# Patient Record
Sex: Female | Born: 1990 | Race: Black or African American | Hispanic: No | Marital: Single | Smoking: Never smoker
Health system: Southern US, Community
[De-identification: ages and names within clinical notes are randomized; demographics above are authoritative.]

## PROBLEM LIST (undated history)

## (undated) DIAGNOSIS — J45909 Unspecified asthma, uncomplicated: Secondary | ICD-10-CM

## (undated) DIAGNOSIS — Z3490 Encounter for supervision of normal pregnancy, unspecified, unspecified trimester: Secondary | ICD-10-CM

## (undated) DIAGNOSIS — R519 Headache, unspecified: Secondary | ICD-10-CM

## (undated) DIAGNOSIS — L509 Urticaria, unspecified: Secondary | ICD-10-CM

## (undated) DIAGNOSIS — R51 Headache: Secondary | ICD-10-CM

## (undated) DIAGNOSIS — J309 Allergic rhinitis, unspecified: Secondary | ICD-10-CM

## (undated) DIAGNOSIS — F419 Anxiety disorder, unspecified: Secondary | ICD-10-CM

## (undated) HISTORY — DX: Headache: R51

## (undated) HISTORY — DX: Urticaria, unspecified: L50.9

## (undated) HISTORY — DX: Unspecified asthma, uncomplicated: J45.909

## (undated) HISTORY — DX: Headache, unspecified: R51.9

## (undated) HISTORY — DX: Allergic rhinitis, unspecified: J30.9

---
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
## 2009-08-27 ENCOUNTER — Emergency Department (HOSPITAL_COMMUNITY): Admission: EM | Admit: 2009-08-27 | Discharge: 2009-08-27 | Payer: Self-pay | Admitting: Emergency Medicine

## 2009-08-31 ENCOUNTER — Emergency Department (HOSPITAL_COMMUNITY): Admission: EM | Admit: 2009-08-31 | Discharge: 2009-08-31 | Payer: Self-pay | Admitting: Emergency Medicine

## 2010-01-21 ENCOUNTER — Emergency Department (HOSPITAL_COMMUNITY)
Admission: EM | Admit: 2010-01-21 | Discharge: 2010-01-21 | Payer: Self-pay | Source: Home / Self Care | Admitting: Family Medicine

## 2010-03-13 ENCOUNTER — Inpatient Hospital Stay (INDEPENDENT_AMBULATORY_CARE_PROVIDER_SITE_OTHER)
Admission: RE | Admit: 2010-03-13 | Discharge: 2010-03-13 | Disposition: A | Discharge status: Home / Self Care | Payer: PRIVATE HEALTH INSURANCE | Source: Ambulatory Visit | Attending: Emergency Medicine | Admitting: Emergency Medicine

## 2010-03-13 DIAGNOSIS — J398 Other specified diseases of upper respiratory tract: Secondary | ICD-10-CM

## 2010-04-19 LAB — STREP A DNA PROBE

## 2010-04-19 LAB — URINALYSIS, ROUTINE W REFLEX MICROSCOPIC
Glucose, UA: NEGATIVE mg/dL
Ketones, ur: NEGATIVE mg/dL
Nitrite: NEGATIVE
Protein, ur: NEGATIVE mg/dL
Urobilinogen, UA: 0.2 mg/dL (ref 0.0–1.0)

## 2010-04-19 LAB — POCT PREGNANCY, URINE: Preg Test, Ur: NEGATIVE

## 2014-09-28 ENCOUNTER — Emergency Department (HOSPITAL_COMMUNITY)
Admission: EM | Admit: 2014-09-28 | Discharge: 2014-09-28 | Disposition: A | Discharge status: Home / Self Care | Payer: BLUE CROSS/BLUE SHIELD | Attending: Emergency Medicine | Admitting: Emergency Medicine

## 2014-09-28 ENCOUNTER — Encounter (HOSPITAL_COMMUNITY): Payer: Self-pay | Admitting: *Deleted

## 2014-09-28 DIAGNOSIS — K1379 Other lesions of oral mucosa: Secondary | ICD-10-CM | POA: Diagnosis not present

## 2014-09-28 DIAGNOSIS — Z880 Allergy status to penicillin: Secondary | ICD-10-CM | POA: Insufficient documentation

## 2014-09-28 DIAGNOSIS — K121 Other forms of stomatitis: Secondary | ICD-10-CM

## 2014-09-28 DIAGNOSIS — Z3202 Encounter for pregnancy test, result negative: Secondary | ICD-10-CM | POA: Diagnosis not present

## 2014-09-28 LAB — POC URINE PREG, ED: Preg Test, Ur: NEGATIVE

## 2014-09-28 MED ORDER — CLINDAMYCIN HCL 150 MG PO CAPS: 300.0000 mg | ORAL_CAPSULE | Freq: Three times a day (TID) | ORAL | Status: DC

## 2014-09-28 MED ORDER — MAGIC MOUTHWASH: 5.0000 mL | Freq: Four times a day (QID) | ORAL | Status: DC | PRN

## 2014-09-28 NOTE — ED Notes (Signed)
Pt states that she was recently on Flagyl. Pt began having N/V and not has sores to her bottom lip and inside of her mouth. Pt was sent here by her MD for r/o steven johnson syndrome. Pt reports difficulty swallowing

## 2014-09-28 NOTE — ED Provider Notes (Signed)
CSN: [id number]     Arrival date & time 09/28/14  1240 History   First MD Initiated Contact with Patient 09/28/14 1320     Chief Complaint  Patient presents with  . Allergic Reaction     (Consider location/radiation/quality/duration/timing/severity/associated sxs/prior Treatment) Patient is a 24 y.o. female presenting with mouth sores.  Mouth Lesions Location:  Lower lip and tongue Lower lip location:  L outer Quality:  Red and blistered (dry skin) Onset quality:  Gradual Severity:  Moderate Duration:  2 days Progression:  Improving Chronicity:  New Context: change in medications and possible infection   Relieved by:  Nothing Worsened by:  Nothing tried Ineffective treatments:  None tried Associated symptoms: no congestion, no fever (on sunday but now resolved), no neck pain, no rash, no rhinorrhea and no sore throat     History reviewed. No pertinent past medical history. History reviewed. No pertinent past surgical history. No family history on file. Social History  Substance Use Topics  . Smoking status: Never Smoker   . Smokeless tobacco: None  . Alcohol Use: None   OB History    No data available     Review of Systems  Constitutional: Negative for fever (on sunday but now resolved).  HENT: Positive for mouth sores. Negative for congestion, rhinorrhea and sore throat.   Eyes: Negative for visual disturbance.  Respiratory: Negative for cough and shortness of breath.   Cardiovascular: Negative for chest pain.  Gastrointestinal: Negative for nausea, vomiting (resolved (on sunday), abdominal pain and diarrhea (resolved (on sunday).  Genitourinary: Negative for difficulty urinating.  Musculoskeletal: Negative for back pain and neck pain.  Skin: Negative for rash.  Neurological: Negative for syncope and headaches.      Allergies  Flagyl and Penicillins  Home Medications   Prior to Admission medications   Medication Sig Start Date End Date Taking? Authorizing  Provider  clindamycin (CLEOCIN) 150 MG capsule Take 2 capsules (300 mg total) by mouth 3 (three) times daily. 09/28/14   Alvira Monday, MD  magic mouthwash SOLN Take 5 mLs by mouth 4 (four) times daily as needed for mouth pain. 09/28/14   Alvira Monday, MD   BP 118/75 mmHg  Pulse 77  Temp(Src) 98 F (36.7 C) (Oral)  Resp 15  Ht  (1.753 m)  Wt 144 lb (65.318 kg)  BMI 21.26 kg/m2  SpO2 100%  LMP 09/07/2014 Physical Exam  Constitutional: She is oriented to person, place, and time. She appears well-developed and well-nourished. No distress.  HENT:  Head: Normocephalic and atraumatic.  Mouth/Throat: No oropharyngeal exudate.  Yellow plaque on tongue and removed tiny vesicles and patient reports tenderness. Left lower lip has area 1 cm which is dry, crusted. No surrounding erythema  Eyes: Conjunctivae and EOM are normal.  Neck: Normal range of motion.  Cardiovascular: Normal rate, regular rhythm, normal heart sounds and intact distal pulses.  Exam reveals no gallop and no friction rub.   No murmur heard. Pulmonary/Chest: Effort normal and breath sounds normal. No respiratory distress. She has no wheezes. She has no rales.  Abdominal: Soft. She exhibits no distension. There is no tenderness. There is no guarding.  Musculoskeletal: She exhibits no edema or tenderness.  Neurological: She is alert and oriented to person, place, and time.  Skin: Skin is warm and dry. No rash noted. She is not diaphoretic. No erythema.  Nursing note and vitals reviewed.   ED Course  Procedures (including critical care time) Labs Review Labs Reviewed  POC  URINE PREG, ED    Imaging Review No results found. I have personally reviewed and evaluated these images and lab results as part of my medical decision-making.   EKG Interpretation None      MDM   Final diagnoses:  Mouth ulceration   24 year old female with history of bacterial vaginosis diagnosed last Sunday started on Flagyl presents  with concern of mouth sores and lips or from urgent care.  Patient reports she had testing and treatment for Bactrim vaginosis last Sunday (metronidazole however then developed nausea, vomiting, diarrhea and fevers on Sunday. Reports she began to feel better and on Tuesday night chiefly to [place] where she starts to notice sores on her tongue, and Wednesday developed a patch of sore dry skin on her lower lip.  Patient went to urgent care where she was evaluated and she was sent here for evaluation for Stevens-Johnson syndrome.  Patient is hemodynamically stable, well-appearing, without conjunctival erythema, without rash other than sores in the mouth.  Patient reports she was tested with HIV with last concerning exposure 4 weeks prior and this was negative. Patient with yellow plaque on tongue when scraped away her tiny vesicles which are sore per patient. She does not have any pharyngeal vesicles or erythema.  Possible viral cause including possible coxsackie (exp in setting of recent n/v/diarrhea/fever) or herpes, although lip lesion not classic.  Discussed the patient should stop Flagyl given it unclear if this contributed.  Prescribed clindamycin as replacement for patient's continuing symptomatic bacterial vaginosis. Prescribed Magic mouthwash for symptomatic relief.  Patient's recommended a follow-up with the primary care physician. She is discharged in stable condition with understanding of reasons to return.    Alvira Monday, MD 09/29/14 [phone]

## 2015-01-06 ENCOUNTER — Emergency Department (INDEPENDENT_AMBULATORY_CARE_PROVIDER_SITE_OTHER): Payer: BLUE CROSS/BLUE SHIELD

## 2015-01-06 ENCOUNTER — Encounter (HOSPITAL_COMMUNITY): Payer: Self-pay | Admitting: Emergency Medicine

## 2015-01-06 ENCOUNTER — Emergency Department (INDEPENDENT_AMBULATORY_CARE_PROVIDER_SITE_OTHER)
Admission: EM | Admit: 2015-01-06 | Discharge: 2015-01-06 | Disposition: A | Discharge status: Home / Self Care | Payer: BLUE CROSS/BLUE SHIELD | Source: Home / Self Care | Attending: Emergency Medicine | Admitting: Emergency Medicine

## 2015-01-06 DIAGNOSIS — M79641 Pain in right hand: Secondary | ICD-10-CM | POA: Diagnosis not present

## 2015-01-06 DIAGNOSIS — S6391XA Sprain of unspecified part of right wrist and hand, initial encounter: Secondary | ICD-10-CM

## 2015-01-06 NOTE — ED Notes (Signed)
Reports fall today, slipped, stumbled, landed on right hand.  Patient said she had keys in right hand and landed still gripping keys.  Fake nail on little finger was snapped, pulling away part of patient's natural nail.  Patient reports pain along lateral edge of hand.  Patient also has pain at base of thumb and runs along this edge of wrist and forearm.

## 2015-01-06 NOTE — Discharge Instructions (Signed)
Musculoskeletal Pain Musculoskeletal pain is muscle and boney aches and pains. These pains can occur in any part of the body. Your caregiver may treat you without knowing the cause of the pain. They may treat you if blood or urine tests, X-rays, and other tests were normal.  CAUSES There is often not a definite cause or reason for these pains. These pains may be caused by a type of germ (virus). The discomfort may also come from overuse. Overuse includes working out too hard when your body is not fit. Boney aches also come from weather changes. Bone is sensitive to atmospheric pressure changes. HOME CARE INSTRUCTIONS   Ask when your test results will be ready. Make sure you get your test results.  Only take over-the-counter or prescription medicines for pain, discomfort, or fever as directed by your caregiver. If you were given medications for your condition, do not drive, operate machinery or power tools, or sign legal documents for 24 hours. Do not drink alcohol. Do not take sleeping pills or other medications that may interfere with treatment.  Continue all activities unless the activities cause more pain. When the pain lessens, slowly resume normal activities. Gradually increase the intensity and duration of the activities or exercise.  During periods of severe pain, bed rest may be helpful. Lay or sit in any position that is comfortable.  Putting ice on the injured area.  Put ice in a bag.  Place a towel between your skin and the bag.  Leave the ice on for 15 to 20 minutes, 3 to 4 times a day.  Follow up with your caregiver for continued problems and no reason can be found for the pain. If the pain becomes worse or does not go away, it may be necessary to repeat tests or do additional testing. Your caregiver may need to look further for a possible cause. SEEK IMMEDIATE MEDICAL CARE IF:  You have pain that is getting worse and is not relieved by medications.  You develop chest pain  that is associated with shortness or breath, sweating, feeling sick to your stomach (nauseous), or throw up (vomit).  Your pain becomes localized to the abdomen.  You develop any new symptoms that seem different or that concern you. MAKE SURE YOU:   Understand these instructions.  Will watch your condition.  Will get help right away if you are not doing well or get worse.   This information is not intended to replace advice given to you by your health care provider. Make sure you discuss any questions you have with your health care provider.  You have a contusion or sprain of the hand. No fractures. Treat with rest, ice and gentle ROM. If you should worsen contact Orthopedics for further evaluation. Ok to use advil or Aleve if needed.    Document Released: 01/19/2005 Document Revised: 04/13/2011 Document Reviewed: 09/23/2012 Elsevier Interactive Patient Education [employer]2016 [employer]

## 2015-01-06 NOTE — ED Provider Notes (Signed)
CSN: [id number]     Arrival date & time 01/06/15  1855 History   First MD Initiated Contact with Patient 01/06/15 1902     Chief Complaint  Patient presents with  . Fall   (Consider location/radiation/quality/duration/timing/severity/associated sxs/prior Treatment) HPI Comments: Patient presents with right hand pain. She fell today while getting gas; thinks she landed on top of hand. Pain along the pinky side of hand. Question swelling. No wrist pain.  The history is provided by the patient.    History reviewed. No pertinent past medical history. History reviewed. No pertinent past surgical history. No family history on file. Social History  Substance Use Topics  . Smoking status: Never Smoker   . Smokeless tobacco: None  . Alcohol Use: None   OB History    No data available     Review of Systems  All other systems reviewed and are negative.   Allergies  Flagyl and Penicillins  Home Medications   Prior to Admission medications   Medication Sig Start Date End Date Taking? Authorizing Provider  clindamycin (CLEOCIN) 150 MG capsule Take 2 capsules (300 mg total) by mouth 3 (three) times daily. 09/28/14   Alvira MondayErin Schlossman, MD  magic mouthwash SOLN Take 5 mLs by mouth 4 (four) times daily as needed for mouth pain. 09/28/14   Alvira MondayErin Schlossman, MD   Meds Ordered and Administered this Visit  Medications - No data to display  BP 104/76 mmHg  Pulse 86  Temp(Src) 98.6 F (37 C) (Oral)  SpO2 100%  LMP 01/03/2015 No data found.   Physical Exam  Constitutional: She is oriented to person, place, and time. She appears well-developed and well-nourished. No distress.  HENT:  Head: Normocephalic and atraumatic.  Musculoskeletal: She exhibits tenderness. She exhibits no edema.  No swelling noted in the right hand or wrist. Broken nail to right 5th. Pain along shaft of right 5th MC shaft and MCP. Decreased flexion of MCP in setting of pain  Neurological: She is alert and oriented to  person, place, and time.  Skin: Skin is warm and dry. No rash noted. She is not diaphoretic.  Psychiatric: Her behavior is normal.  Nursing note and vitals reviewed.   ED Course  Procedures (including critical care time)  Labs Review Labs Reviewed - No data to display  Imaging Review Dg Hand Complete Right  01/06/2015  CLINICAL DATA:  Fall getting out of car, thumb pain EXAM: RIGHT HAND - COMPLETE 3+ VIEW COMPARISON:  None. FINDINGS: No fracture or dislocation is seen. The joint spaces are preserved. Visualized soft tissues are within normal limits. IMPRESSION: No fracture or dislocation is seen. Electronically Signed   By: Charline BillsSriyesh  Krishnan M.D.   On: 01/06/2015 19:41     Visual Acuity Review  Right Eye Distance:   Left Eye Distance:   Bilateral Distance:    Right Eye Near:   Left Eye Near:    Bilateral Near:         MDM   1. Hand sprain, right, initial encounter   2. Hand pain, right    No evidence of fracture. Treat symptomatically. Contact Ortho if not improving within 1-2 weeks.    Riki SheerMichelle G Latisa Belay, PA-C 01/06/15 2005

## 2015-04-09 ENCOUNTER — Encounter (HOSPITAL_COMMUNITY): Payer: Self-pay | Admitting: Cardiology

## 2015-04-09 ENCOUNTER — Emergency Department (HOSPITAL_COMMUNITY)
Admission: EM | Admit: 2015-04-09 | Discharge: 2015-04-09 | Disposition: A | Discharge status: Home / Self Care | Payer: 59 | Attending: Emergency Medicine | Admitting: Emergency Medicine

## 2015-04-09 DIAGNOSIS — Z880 Allergy status to penicillin: Secondary | ICD-10-CM | POA: Insufficient documentation

## 2015-04-09 DIAGNOSIS — T7840XA Allergy, unspecified, initial encounter: Secondary | ICD-10-CM | POA: Diagnosis present

## 2015-04-09 DIAGNOSIS — X58XXXA Exposure to other specified factors, initial encounter: Secondary | ICD-10-CM | POA: Diagnosis not present

## 2015-04-09 DIAGNOSIS — Z79899 Other long term (current) drug therapy: Secondary | ICD-10-CM | POA: Diagnosis not present

## 2015-04-09 DIAGNOSIS — Y9389 Activity, other specified: Secondary | ICD-10-CM | POA: Insufficient documentation

## 2015-04-09 DIAGNOSIS — Y9289 Other specified places as the place of occurrence of the external cause: Secondary | ICD-10-CM | POA: Diagnosis not present

## 2015-04-09 DIAGNOSIS — Y999 Unspecified external cause status: Secondary | ICD-10-CM | POA: Diagnosis not present

## 2015-04-09 DIAGNOSIS — Z7951 Long term (current) use of inhaled steroids: Secondary | ICD-10-CM | POA: Diagnosis not present

## 2015-04-09 DIAGNOSIS — T3694XA Poisoning by unspecified systemic antibiotic, undetermined, initial encounter: Secondary | ICD-10-CM | POA: Diagnosis not present

## 2015-04-09 MED ORDER — FAMOTIDINE 20 MG PO TABS: 20.0000 mg | ORAL_TABLET | Freq: Two times a day (BID) | ORAL | Status: DC

## 2015-04-09 MED ORDER — DIPHENHYDRAMINE HCL 50 MG/ML IJ SOLN
25.0000 mg | Freq: Once | INTRAMUSCULAR | Status: AC
  Administered 2015-04-09: 25 mg via INTRAVENOUS
  Filled 2015-04-09: qty 1

## 2015-04-09 MED ORDER — FLUCONAZOLE 200 MG PO TABS: 200.0000 mg | ORAL_TABLET | Freq: Every day | ORAL | Status: AC

## 2015-04-09 MED ORDER — FAMOTIDINE IN NACL 20-0.9 MG/50ML-% IV SOLN
20.0000 mg | Freq: Once | INTRAVENOUS | Status: AC
  Administered 2015-04-09: 20 mg via INTRAVENOUS
  Filled 2015-04-09: qty 50

## 2015-04-09 MED ORDER — METHYLPREDNISOLONE SODIUM SUCC 125 MG IJ SOLR
125.0000 mg | Freq: Once | INTRAMUSCULAR | Status: AC
  Administered 2015-04-09: 125 mg via INTRAVENOUS
  Filled 2015-04-09: qty 2

## 2015-04-09 MED ORDER — SODIUM CHLORIDE 0.9 % IV BOLUS (SEPSIS)
1000.0000 mL | Freq: Once | INTRAVENOUS | Status: AC
  Administered 2015-04-09: 1000 mL via INTRAVENOUS

## 2015-04-09 MED ORDER — PREDNISONE 50 MG PO TABS: 50.0000 mg | ORAL_TABLET | Freq: Every day | ORAL | Status: DC

## 2015-04-09 MED ORDER — FLUCONAZOLE 100 MG PO TABS
200.0000 mg | ORAL_TABLET | Freq: Every day | ORAL | Status: DC
  Administered 2015-04-09: 200 mg via ORAL
  Filled 2015-04-09: qty 2

## 2015-04-09 NOTE — ED Provider Notes (Signed)
CSN: [id number]     Arrival date & time 04/09/15  1157 History   First MD Initiated Contact with Patient 04/09/15 1436     Chief Complaint  Patient presents with  . Allergic Reaction     (Consider location/radiation/quality/duration/timing/severity/associated sxs/prior Treatment) HPI Patient presents to the emergency department with allergic reaction that started on Sunday.  The patient states she took antibiotics and swelling to her lower lip, tongue and felt like her throat was swelling.  The patient states that this got worse today she noticed some areas of ulceration on her tongue.  The patient states that nothing seems make the condition better or worse.  Patient states she did take Benadryl at home.  Patient denies chest dentures breath, nausea, vomiting, weakness to his headache, blurred vision, back pain, neck pain, fever, dysuria, incontinence, near syncope or syncope History reviewed. No pertinent past medical history. History reviewed. No pertinent past surgical history. History reviewed. No pertinent family history. Social History  Substance Use Topics  . Smoking status: Never Smoker   . Smokeless tobacco: None  . Alcohol Use: Yes   OB History    No data available     Review of Systems  All other systems negative except as documented in the HPI. All pertinent positives and negatives as reviewed in the HPI.  Allergies  Azithromycin; Flagyl; and Penicillins  Home Medications   Prior to Admission medications   Medication Sig Start Date End Date Taking? Authorizing Provider  albuterol (PROVENTIL HFA;VENTOLIN HFA) 108 (90 Base) MCG/ACT inhaler Inhale 1 puff into the lungs every 6 (six) hours as needed for wheezing or shortness of breath.   Yes Historical Provider, MD  diphenhydrAMINE (BENADRYL) 25 MG tablet Take 25 mg by mouth every 6 (six) hours as needed (allergic reaction).    Yes Historical Provider, MD  fluticasone (FLONASE) 50 MCG/ACT nasal spray Place 1 spray into  both nostrils daily.   Yes Historical Provider, MD  SUMAtriptan Succinate Refill (IMITREX STATDOSE REFILL) 6 MG/0.5ML SOCT Inject 1 Dose into the skin See admin instructions. One injection at migraine onset, may repeat after 2 hours if needed. No more than 2 doses in 24 hours. 06/28/14 06/28/15 Yes Historical Provider, MD   BP 107/68 mmHg  Pulse 69  Temp(Src) 98.2 F (36.8 C) (Oral)  Resp 16  Ht  (1.702 m)  Wt 69.854 kg  BMI 24.11 kg/m2  SpO2 99%  LMP 03/18/2015 Physical Exam  Constitutional: She is oriented to person, place, and time. She appears well-developed and well-nourished. No distress.  HENT:  Head: Normocephalic and atraumatic.  Mouth/Throat: Uvula is midline, oropharynx is clear and moist and mucous membranes are normal. No trismus in the jaw. No uvula swelling.    Eyes: Pupils are equal, round, and reactive to light.  Neck: Normal range of motion. Neck supple.  Cardiovascular: Normal rate, regular rhythm and normal heart sounds.  Exam reveals no gallop and no friction rub.   No murmur heard. Pulmonary/Chest: Effort normal and breath sounds normal. No respiratory distress. She has no wheezes.  Abdominal: Soft. Bowel sounds are normal. She exhibits no distension. There is no tenderness.  Neurological: She is alert and oriented to person, place, and time. She exhibits normal muscle tone. Coordination normal.  Skin: Skin is warm and dry. No rash noted. No erythema.  Psychiatric: She has a normal mood and affect. Her behavior is normal.  Nursing note and vitals reviewed.   ED Course  Procedures (including critical care time)  Labs Review Labs Reviewed - No data to display  Imaging Review No results found. I have personally reviewed and evaluated these images and lab results as part of my medical decision-making.  Patient has been observed here in the emergency department for several hours.  Patient has been stable.  She will be treated for a yeast after the  antibiotic usage  Charlestine NightChristopher Smayan Hackbart, PA-C 04/09/15 1919  Mancel BaleElliott Wentz, MD 04/11/15 0002

## 2015-04-09 NOTE — ED Notes (Signed)
Pt states she feels like the back of her tongue is swelling no other complaints noted at this time triage RN aware

## 2015-04-09 NOTE — ED Notes (Signed)
Pt reports she had an allergic reaction to an antibiotics on Sunday and was given steroids. States yesterday her lips started to swell again and her developed sores on her tongue. Reports pain with swallowing.

## 2015-04-09 NOTE — Discharge Instructions (Signed)
Return here as needed.  Follow-up with your primary care doctor.  Return here as needed °

## 2015-04-09 NOTE — ED Notes (Signed)
Pt reports that she feels like her tongue is swelling more. Trouble swallowing her saliva.

## 2015-04-10 LAB — GC/CHLAMYDIA PROBE AMP (~~LOC~~) NOT AT ARMC
Chlamydia: NEGATIVE
Neisseria Gonorrhea: NEGATIVE

## 2015-08-22 DIAGNOSIS — Z8759 Personal history of other complications of pregnancy, childbirth and the puerperium: Secondary | ICD-10-CM | POA: Insufficient documentation

## 2015-08-31 ENCOUNTER — Emergency Department (HOSPITAL_COMMUNITY)
Admission: EM | Admit: 2015-08-31 | Discharge: 2015-08-31 | Disposition: A | Discharge status: Home / Self Care | Payer: 59 | Attending: Emergency Medicine | Admitting: Emergency Medicine

## 2015-08-31 ENCOUNTER — Emergency Department (HOSPITAL_COMMUNITY): Payer: 59

## 2015-08-31 ENCOUNTER — Encounter (HOSPITAL_COMMUNITY): Payer: Self-pay

## 2015-08-31 DIAGNOSIS — O034 Incomplete spontaneous abortion without complication: Secondary | ICD-10-CM | POA: Insufficient documentation

## 2015-08-31 DIAGNOSIS — R109 Unspecified abdominal pain: Secondary | ICD-10-CM

## 2015-08-31 DIAGNOSIS — O26851 Spotting complicating pregnancy, first trimester: Secondary | ICD-10-CM | POA: Diagnosis present

## 2015-08-31 DIAGNOSIS — O021 Missed abortion: Secondary | ICD-10-CM

## 2015-08-31 DIAGNOSIS — Z3A09 9 weeks gestation of pregnancy: Secondary | ICD-10-CM | POA: Insufficient documentation

## 2015-08-31 DIAGNOSIS — O039 Complete or unspecified spontaneous abortion without complication: Secondary | ICD-10-CM

## 2015-08-31 HISTORY — DX: Encounter for supervision of normal pregnancy, unspecified, unspecified trimester: Z34.90

## 2015-08-31 LAB — COMPREHENSIVE METABOLIC PANEL
ALT: 15 U/L (ref 14–54)
AST: 17 U/L (ref 15–41)
Albumin: 4.1 g/dL (ref 3.5–5.0)
Alkaline Phosphatase: 32 U/L — ABNORMAL LOW (ref 38–126)
Anion gap: 6 (ref 5–15)
BUN: 7 mg/dL (ref 6–20)
CHLORIDE: 108 mmol/L (ref 101–111)
CO2: 23 mmol/L (ref 22–32)
Calcium: 9.2 mg/dL (ref 8.9–10.3)
Creatinine, Ser: 0.77 mg/dL (ref 0.44–1.00)
Glucose, Bld: 93 mg/dL (ref 65–99)
POTASSIUM: 3.8 mmol/L (ref 3.5–5.1)
Sodium: 137 mmol/L (ref 135–145)
Total Bilirubin: 1 mg/dL (ref 0.3–1.2)
Total Protein: 7 g/dL (ref 6.5–8.1)

## 2015-08-31 LAB — CBC
HEMATOCRIT: 39.6 % (ref 36.0–46.0)
Hemoglobin: 13.5 g/dL (ref 12.0–15.0)
MCH: 31.1 pg (ref 26.0–34.0)
MCHC: 34.1 g/dL (ref 30.0–36.0)
MCV: 91.2 fL (ref 78.0–100.0)
Platelets: 281 10*3/uL (ref 150–400)
RBC: 4.34 MIL/uL (ref 3.87–5.11)
RDW: 11.9 % (ref 11.5–15.5)
WBC: 6.6 10*3/uL (ref 4.0–10.5)

## 2015-08-31 LAB — I-STAT BETA HCG BLOOD, ED (MC, WL, AP ONLY): HCG, QUANTITATIVE: 10.5 m[IU]/mL — AB (ref <5)

## 2015-08-31 MED ORDER — TRAMADOL HCL 50 MG PO TABS: 50.0000 mg | ORAL_TABLET | Freq: Four times a day (QID) | ORAL | 0 refills | Status: DC | PRN

## 2015-08-31 MED ORDER — NAPROXEN 250 MG PO TABS
500.0000 mg | ORAL_TABLET | Freq: Once | ORAL | Status: AC
  Administered 2015-08-31: 500 mg via ORAL
  Filled 2015-08-31: qty 2

## 2015-08-31 NOTE — ED Provider Notes (Signed)
[facility]-EMERGENCY DEPT Provider Note   CSN: [id number] Arrival date & time: 08/31/15  2993  First Provider Contact:  First MD Initiated Contact with Patient 08/31/15 1130     History   Chief Complaint Chief Complaint  Patient presents with  . Abdominal Pain   HPI  Regina Griffith is an 25 y.o. female G1P0 who presents to the ED for evaluation of abdominal cramps and vaginal spotting in the setting of pregnancy. She is an otherwise healthy female who states she is approximately [redacted] weeks pregnant. She states for the past several weeks she has had intermittent lower abdominal cramping and vaginal spotting. She states the bleeding has not been heavy but her cramps are getting worse in severity. Denies N/V. She works as a Financial controller and three weeks ago had an [place] at an outside facility and was told everything was "normal" and they did not see an ectopic. However, she has not been able to follow up with OB. She has an OB appointment this Monday 7/31. She denies feeling dizzy or lightheaded. Denies chest pain or SOB.  Past Medical History:  Diagnosis Date  . Pregnant     There are no active problems to display for this patient.   History reviewed. No pertinent surgical history.  OB History    Gravida Para Term Preterm AB Living   1             SAB TAB Ectopic Multiple Live Births                   Home Medications    Prior to Admission medications   Medication Sig Start Date End Date Taking? Authorizing Provider  albuterol (PROVENTIL HFA;VENTOLIN HFA) 108 (90 Base) MCG/ACT inhaler Inhale 1 puff into the lungs every 6 (six) hours as needed for wheezing or shortness of breath.    Historical Provider, MD  diphenhydrAMINE (BENADRYL) 25 MG tablet Take 25 mg by mouth every 6 (six) hours as needed (allergic reaction).     Historical Provider, MD  famotidine (PEPCID) 20 MG tablet Take 1 tablet (20 mg total) by mouth 2 (two) times daily. 04/09/15   Christopher Lawyer, PA-C  fluticasone  (FLONASE) 50 MCG/ACT nasal spray Place 1 spray into both nostrils daily.    Historical Provider, MD  predniSONE (DELTASONE) 50 MG tablet Take 1 tablet (50 mg total) by mouth daily. 04/09/15   Charlestine Night, PA-C  SUMAtriptan Succinate Refill (IMITREX STATDOSE REFILL) 6 MG/0.5ML SOCT Inject 1 Dose into the skin See admin instructions. One injection at migraine onset, may repeat after 2 hours if needed. No more than 2 doses in 24 hours. 06/28/14 06/28/15  Historical Provider, MD    Family History No family history on file.  Social History Social History  Substance Use Topics  . Smoking status: Never Smoker  . Smokeless tobacco: Never Used  . Alcohol use No     Allergies   Azithromycin; Flagyl [metronidazole]; and Penicillins   Review of Systems Review of Systems  All other systems reviewed and are negative.    Physical Exam Updated Vital Signs BP 116/87 (BP Location: Right Arm)   Pulse 84   Temp 98.4 F (36.9 C) (Oral)   Resp 14   LMP 06/19/2015   SpO2 100%   Physical Exam  Constitutional: She is oriented to person, place, and time.  HENT:  Right Ear: External ear normal.  Left Ear: External ear normal.  Nose: Nose normal.  Mouth/Throat: Oropharynx is clear and  moist. No oropharyngeal exudate.  Eyes: Conjunctivae and EOM are normal. Pupils are equal, round, and reactive to light.  Neck: Normal range of motion. Neck supple.  Cardiovascular: Normal rate, regular rhythm, normal heart sounds and intact distal pulses.   Pulmonary/Chest: Effort normal and breath sounds normal. No respiratory distress. She has no wheezes. She exhibits no tenderness.  Abdominal: Soft. Bowel sounds are normal. She exhibits no distension. There is no tenderness. There is no rebound and no guarding.  Genitourinary:  Genitourinary Comments: Pt declines pelvic exam  Musculoskeletal: She exhibits no edema.  Neurological: She is alert and oriented to person, place, and time. No cranial nerve  deficit.  Skin: Skin is warm and dry.  Psychiatric: She has a normal mood and affect.  Nursing note and vitals reviewed.    ED Treatments / Results  Labs (all labs ordered are listed, but only abnormal results are displayed) Labs Reviewed  COMPREHENSIVE METABOLIC PANEL - Abnormal; Notable for the following:       Result Value   Alkaline Phosphatase 32 (*)    All other components within normal limits  I-STAT BETA HCG BLOOD, ED ([facility], [facility], [facility] ONLY) - Abnormal; Notable for the following:    I-stat hCG, quantitative 10.5 (*)    All other components within normal limits  CBC    EKG  EKG Interpretation None       Radiology US Ob Comp Less 14 Wks  Result Date: 08/31/2015 CLINICAL DATA:  Abdominal pain and cramping for 4 days. EXAM: OBSTETRIC <14 WK [place] AND TRANSVAGINAL OB US TECHNIQUE: Both transabdominal and transvaginal ultrasound examinations were performed for complete evaluation of the gestation as well as the maternal uterus, adnexal regions, and pelvic cul-de-sac. Transvaginal technique was performed to assess early pregnancy. COMPARISON:  None. FINDINGS: Intrauterine gestational sac: None visualized Yolk sac:  None visualized Embryo:  None visualized Cardiac Activity: Heart Rate:   bpm MSD:   mm    w     d CRL:    mm    w    d                  [place] EDC: Subchorionic hemorrhage:  None visualized. Maternal uterus/adnexae: Complex cystic area within the left ovary measures up to 3.8 cm, likely hemorrhagic cyst. Right ovary unremarkable. No free fluid. Hypoechoic area noted posteriorly in the uterus measuring up to 11 mm, likely small subserosal fibroid. Heterogeneous appearance of the endometrium in the fundus which appears mildly thickened relative to the remainder of the endometrium. This portion of the endometrium measures 6 mm compared with 4 mm in the lower uterine body. Small amount of fluid around this portion of the endometrium with increased vascularity. IMPRESSION: No intrauterine  pregnancy. Focal area of increased thickness of the endometrium within the fundus with a small amount of surrounding fluid and increased vascularity. This could reflect retained products of conception. 3.8 cm hemorrhagic left ovarian cyst. Electronically Signed   By: Charlett Nose M.D.   On: 08/31/2015 13:38  US Ob Transvaginal  Result Date: 08/31/2015 CLINICAL DATA:  Abdominal pain and cramping for 4 days. EXAM: OBSTETRIC <14 WK [place] AND TRANSVAGINAL OB US TECHNIQUE: Both transabdominal and transvaginal ultrasound examinations were performed for complete evaluation of the gestation as well as the maternal uterus, adnexal regions, and pelvic cul-de-sac. Transvaginal technique was performed to assess early pregnancy. COMPARISON:  None. FINDINGS: Intrauterine gestational sac: None visualized Yolk sac:  None visualized Embryo:  None visualized Cardiac  Activity: Heart Rate:   bpm MSD:   mm    w     d CRL:    mm    w    d                  [place] EDC: Subchorionic hemorrhage:  None visualized. Maternal uterus/adnexae: Complex cystic area within the left ovary measures up to 3.8 cm, likely hemorrhagic cyst. Right ovary unremarkable. No free fluid. Hypoechoic area noted posteriorly in the uterus measuring up to 11 mm, likely small subserosal fibroid. Heterogeneous appearance of the endometrium in the fundus which appears mildly thickened relative to the remainder of the endometrium. This portion of the endometrium measures 6 mm compared with 4 mm in the lower uterine body. Small amount of fluid around this portion of the endometrium with increased vascularity. IMPRESSION: No intrauterine pregnancy. Focal area of increased thickness of the endometrium within the fundus with a small amount of surrounding fluid and increased vascularity. This could reflect retained products of conception. 3.8 cm hemorrhagic left ovarian cyst. Electronically Signed   By: Charlett Nose M.D.   On: 08/31/2015 13:38    Procedures Procedures  (including critical care time)  Medications Ordered in ED Medications  naproxen (NAPROSYN) tablet 500 mg (500 mg Oral Given 08/31/15 1200)     Initial Impression / Assessment and Plan / ED Course  I have reviewed the triage vital signs and the nursing notes.  Pertinent labs & imaging results that were available during my care of the patient were reviewed by me and considered in my medical decision making (see chart for details).  Clinical Course   Pt is apparently [redacted] weeks pregnant with persistent abdominal cramping and vaginal bleeding. hcg today is only 10.5. Suspect pt had a spontaneous abortion. She declines pelvic exam today. I did encourage her to stay for [place] to r/o retained POC. She is amenable to this plan.  1:52 PM US reveals possible retained products of conception. Pt [facility]. I spoke to their answering service to page the on-call physician.   I spoke with Dr. Vincente Poli. Expectant mangement for now. Call the office Monday to schedule f/u with Dr. Henderson Cloud. Discuss bleeding precautions. I spoke with pt about the aforementioned information. She verbalized her understanding and agreement. Will give short course of tramadol as needed for pain. ER return precautions given.  Final Clinical Impressions(s) / ED Diagnoses   Final diagnoses:  Retained products of conception, early pregnancy  Spontaneous abortion in first trimester    New Prescriptions Discharge Medication List as of 08/31/2015  2:13 PM    START taking these medications   Details  traMADol (ULTRAM) 50 MG tablet Take 1 tablet (50 mg total) by mouth every 6 (six) hours as needed., Starting Sat 08/31/2015, Print         Carlene Coria, PA-C 08/31/15 1741    Pricilla Loveless, MD 09/04/15 [phone]

## 2015-08-31 NOTE — ED Triage Notes (Signed)
Patient here with abdominal cramping and vaginal bleeding /spotting x 3 weeks. States that she is about [redacted] weeks pregnant and [place]. NAD

## 2015-08-31 NOTE — Discharge Instructions (Signed)
Please call Dr. Huel Coventry office on Monday to schedule a follow up appointment. As we discussed, return to the ER or go to [facility] for new or worsening symptoms such as heavy bleeding, passing out, etc.

## 2015-11-19 ENCOUNTER — Encounter: Payer: Self-pay | Admitting: Allergy and Immunology

## 2015-11-19 ENCOUNTER — Ambulatory Visit (INDEPENDENT_AMBULATORY_CARE_PROVIDER_SITE_OTHER): Payer: PRIVATE HEALTH INSURANCE | Admitting: Allergy and Immunology

## 2015-11-19 ENCOUNTER — Encounter (INDEPENDENT_AMBULATORY_CARE_PROVIDER_SITE_OTHER): Payer: Self-pay

## 2015-11-19 VITALS — BP 108/68 | HR 96 | Temp 98.5°F | Resp 18 | Ht 67.5 in | Wt 150.2 lb

## 2015-11-19 DIAGNOSIS — T50905A Adverse effect of unspecified drugs, medicaments and biological substances, initial encounter: Secondary | ICD-10-CM

## 2015-11-19 DIAGNOSIS — J310 Chronic rhinitis: Secondary | ICD-10-CM | POA: Diagnosis not present

## 2015-11-19 DIAGNOSIS — J4520 Mild intermittent asthma, uncomplicated: Secondary | ICD-10-CM

## 2015-11-19 DIAGNOSIS — T887XXA Unspecified adverse effect of drug or medicament, initial encounter: Secondary | ICD-10-CM | POA: Diagnosis not present

## 2015-11-19 MED ORDER — AZELASTINE-FLUTICASONE 137-50 MCG/ACT NA SUSP: 1.0000 | Freq: Two times a day (BID) | NASAL | 5 refills | Status: DC

## 2015-11-19 MED ORDER — LEVOCETIRIZINE DIHYDROCHLORIDE 5 MG PO TABS: 5.0000 mg | ORAL_TABLET | ORAL | 3 refills | Status: DC | PRN

## 2015-11-19 NOTE — Progress Notes (Signed)
New Patient Note  RE: Regina Griffith MRN: [id number] DOB: October 31, 1990 Date of Office Visit: 11/19/2015  Referring provider: Mare Ferrari, MD Primary care provider: Leslie Andrea, MD  Chief Complaint: Allergic Rhinitis ; Asthma; and Allergic Reaction   History of present illness: Regina Griffith is a 25 y.o. female presenting today for consultation of rhinosinusitis.  She reports that over the past 3 years she has experienced nasal congestion, rhinorrhea, postnasal drainage, sinus pressure, ear pressure, and occasional ocular pruritus. No significant seasonal symptom variation has been noted nor have specific environmental triggers been identified.  She has noticed that the sinus pressure and rhinorrhea are exacerbated by elevation/barometric pressure changes.  She has 2 or 3 sinus infections requiring antibiotics per year on average.  She has a history of intermittent asthma and carries albuterol HFA for rescue purposes.  Her lower respiratory symptoms are triggered by extremes of temperature and/or vigorous physical exertion.  She reports that she took Diflucan last night for a vaginal yeast infection and within 5 minutes experienced a tingling sensation around her lips as well as generalized pruritus.  She took diphenhydramine and woke up this morning with angioedema of the lips.  She went to her primary care physician's office this morning and was treated with a Kenalog injection.  She reports that the symptoms associated with the allergic reaction have dissipated.   Assessment and plan: Chronic rhinitis We were unable to perform allergy skin testing today due to recent administration of antihistamines to treat an allergic reaction to fluconazole.  The patient will return for testing in 1-2 weeks after having been off of antihistamines for at least 3 days.   A prescription has been provided for Dymista (azelastine/fluticasone) nasal spray, 1 spray per nostril twice daily as  needed. Proper nasal spray technique has been discussed and demonstrated.  Nasal saline lavage (NeilMed) as needed has been recommended along with instructions for proper administration.  A prescription has been provided for levocetirizine, 5 mg daily as needed.  For thick post nasal drainage, nasal congestion, and/or sinus pressure, add guaifenesin 1200 mg (Mucinex Maximum Strength) plus/minus pseudoephedrine 120 mg  twice daily as needed with adequate hydration as discussed. Pseudoephedrine is only to be used for short-term relief of nasal/sinus congestion. Long-term use is discouraged due to potential side effects.  Mild intermittent asthma  Continue albuterol HFA, 1-2 inhalations every 4-6 hours as needed.  Subjective and objective measures of pulmonary function will be followed and the treatment plan will be adjusted accordingly.  Adverse drug reaction Agam's history suggests fluconazole drug reaction.  Strictly avoid the culprit medication class.  Should symptoms recur, persist, or progress in the absence of this medication, she is to keep a detailed symptom/exposure journal.   Meds ordered this encounter  Medications  . Azelastine-Fluticasone (DYMISTA) 137-50 MCG/ACT SUSP    Sig: Place 1 spray into both nostrils 2 (two) times daily.    Dispense:  1 Bottle    Refill:  5    1 spray per nostril twice daily as needed.  Marland Kitchen levocetirizine (XYZAL) 5 MG tablet    Sig: Take 1 tablet (5 mg total) by mouth as needed for allergies.    Dispense:  30 tablet    Refill:  3    1 tablet 5 mg daily as needed    Diagnostics: Spirometry:  Normal with an FEV1 of 113% predicted.  Please see scanned spirometry results for details.    Physical examination: Blood pressure 108/68, pulse 96, temperature  98.5 F (36.9 C), temperature source Oral, resp. rate 18, height 5' 7.5" (1.715 m), weight 150 lb 3.2 oz (68.1 kg), last menstrual period 06/19/2015, SpO2 98 %.  General: Alert,  interactive, in no acute distress. HEENT: TMs pearly gray, turbinates moderately edematous with clear discharge, post-pharynx erythematous. Neck: Supple without lymphadenopathy. Lungs: Clear to auscultation without wheezing, rhonchi or rales. CV: Normal S1, S2 without murmurs. Abdomen: Nondistended, nontender. Skin: Warm and dry, without lesions or rashes. Extremities:  No clubbing, cyanosis or edema. Neuro:   Grossly intact.  Review of systems:  Review of systems negative except as noted in HPI / PMHx or noted below: ROS  Past medical history:  Past Medical History:  Diagnosis Date  . Asthma   . Pregnant   . Urticaria     Past surgical history: Reviewed.  No past surgical history reported.  Family history: No significant or contributory family history has been reported.  Social history: Social History   Social History  . Marital status: Single    Spouse name: N/A  . Number of children: N/A  . Years of education: N/A   Occupational History  . Not on file.   Social History Main Topics  . Smoking status: Never Smoker  . Smokeless tobacco: Never Used  . Alcohol use No  . Drug use: No  . Sexual activity: Yes    Partners: Male    Birth control/ protection: Pill   Other Topics Concern  . Not on file   Social History Narrative  . No narrative on file   Environmental History: The patient lives in a house with hardwood floors throughout, gas heat, and central air.  There are dogs in house which do not have access to her bedroom.  She is a nonsmoker.  She is a Financial controllerflight attendant.    Medication List       Accurate as of 11/19/15  2:52 PM. Always use your most recent med list.          albuterol 108 (90 Base) MCG/ACT inhaler Commonly known as:  PROVENTIL HFA;VENTOLIN HFA Inhale 1 puff into the lungs every 6 (six) hours as needed for wheezing or shortness of breath.   Azelastine-Fluticasone 137-50 MCG/ACT Susp Commonly known as:  DYMISTA Place 1 spray into  both nostrils 2 (two) times daily.   diphenhydrAMINE 25 MG tablet Commonly known as:  BENADRYL Take 25 mg by mouth every 6 (six) hours as needed (allergic reaction).   famotidine 20 MG tablet Commonly known as:  PEPCID Take 1 tablet (20 mg total) by mouth 2 (two) times daily.   fluconazole 150 MG tablet Commonly known as:  DIFLUCAN Take 150 mg by mouth once.   fluticasone 50 MCG/ACT nasal spray Commonly known as:  FLONASE Place 1 spray into both nostrils daily.   IMITREX STATDOSE REFILL 6 MG/0.5ML Soct Generic drug:  SUMAtriptan Succinate Refill Inject 1 Dose into the skin See admin instructions. One injection at migraine onset, may repeat after 2 hours if needed. No more than 2 doses in 24 hours.   levocetirizine 5 MG tablet Commonly known as:  XYZAL Take 1 tablet (5 mg total) by mouth as needed for allergies.   metroNIDAZOLE 0.75 % vaginal gel Commonly known as:  METROGEL Place 0.75 application vaginally daily.   predniSONE 50 MG tablet Commonly known as:  DELTASONE Take 1 tablet (50 mg total) by mouth daily.   traMADol 50 MG tablet Commonly known as:  ULTRAM Take 1 tablet (50 mg  total) by mouth every 6 (six) hours as needed.       Known medication allergies: Allergies  Allergen Reactions  . Azithromycin Anaphylaxis and Swelling  . Flagyl [Metronidazole] Anaphylaxis and Swelling    Pt states she can use the gel form fine.  . Penicillins Rash    Has patient had a PCN reaction causing immediate rash, facial/tongue/throat swelling, SOB or lightheadedness with hypotension: Yes Has patient had a PCN reaction causing severe rash involving mucus membranes or skin necrosis: No Has patient had a PCN reaction that required hospitalization No Has patient had a PCN reaction occurring within the last 10 years: No If all of the above answers are "NO", then may proceed with Cephalosporin use.     I appreciate the opportunity to take part in Lylie's care. Please do not  hesitate to contact me with questions.  Sincerely,   R. Jorene Guest, MD

## 2015-11-19 NOTE — Assessment & Plan Note (Signed)
   Continue albuterol HFA, 1-2 inhalations every 4-6 hours as needed.  Subjective and objective measures of pulmonary function will be followed and the treatment plan will be adjusted accordingly. 

## 2015-11-19 NOTE — Assessment & Plan Note (Signed)
Regina Griffith's history suggests fluconazole drug reaction.  Strictly avoid the culprit medication class.  Should symptoms recur, persist, or progress in the absence of this medication, she is to keep a detailed symptom/exposure journal.

## 2015-11-19 NOTE — Patient Instructions (Addendum)
Chronic rhinitis We were unable to perform allergy skin testing today due to recent administration of antihistamines to treat an allergic reaction to fluconazole.  The patient will return for testing in 1-2 weeks after having been off of antihistamines for at least 3 days.   A prescription has been provided for Dymista (azelastine/fluticasone) nasal spray, 1 spray per nostril twice daily as needed. Proper nasal spray technique has been discussed and demonstrated.  Nasal saline lavage (NeilMed) as needed has been recommended along with instructions for proper administration.  A prescription has been provided for levocetirizine, 5 mg daily as needed.  For thick post nasal drainage, nasal congestion, and/or sinus pressure, add guaifenesin 1200 mg (Mucinex Maximum Strength) plus/minus pseudoephedrine 120 mg  twice daily as needed with adequate hydration as discussed. Pseudoephedrine is only to be used for short-term relief of nasal/sinus congestion. Long-term use is discouraged due to potential side effects.  Mild intermittent asthma  Continue albuterol HFA, 1-2 inhalations every 4-6 hours as needed.  Subjective and objective measures of pulmonary function will be followed and the treatment plan will be adjusted accordingly.  Adverse drug reaction Regina Griffith's history suggests fluconazole drug reaction.  Strictly avoid the culprit medication class.  Should symptoms recur, persist, or progress in the absence of this medication, she is to keep a detailed symptom/exposure journal.   Return in about 1 week (around 11/26/2015) for allergy skin testing.

## 2015-11-19 NOTE — Assessment & Plan Note (Addendum)
We were unable to perform allergy skin testing today due to recent administration of antihistamines to treat an allergic reaction to fluconazole.  The patient will return for testing in 1-2 weeks after having been off of antihistamines for at least 3 days.   A prescription has been provided for Dymista (azelastine/fluticasone) nasal spray, 1 spray per nostril twice daily as needed. Proper nasal spray technique has been discussed and demonstrated.  Nasal saline lavage (NeilMed) as needed has been recommended along with instructions for proper administration.  A prescription has been provided for levocetirizine, 5 mg daily as needed.  For thick post nasal drainage, nasal congestion, and/or sinus pressure, add guaifenesin 1200 mg (Mucinex Maximum Strength) plus/minus pseudoephedrine 120 mg  twice daily as needed with adequate hydration as discussed. Pseudoephedrine is only to be used for short-term relief of nasal/sinus congestion. Long-term use is discouraged due to potential side effects.

## 2015-11-20 ENCOUNTER — Encounter (HOSPITAL_COMMUNITY): Payer: Self-pay

## 2015-11-20 ENCOUNTER — Emergency Department (HOSPITAL_COMMUNITY)
Admission: EM | Admit: 2015-11-20 | Discharge: 2015-11-20 | Disposition: A | Discharge status: Home / Self Care | Payer: 59 | Attending: Emergency Medicine | Admitting: Emergency Medicine

## 2015-11-20 ENCOUNTER — Ambulatory Visit: Payer: 59 | Admitting: Diagnostic Neuroimaging

## 2015-11-20 DIAGNOSIS — J45909 Unspecified asthma, uncomplicated: Secondary | ICD-10-CM | POA: Insufficient documentation

## 2015-11-20 DIAGNOSIS — T378X1A Poisoning by other specified systemic anti-infectives and antiparasitics, accidental (unintentional), initial encounter: Secondary | ICD-10-CM | POA: Diagnosis not present

## 2015-11-20 DIAGNOSIS — Z91040 Latex allergy status: Secondary | ICD-10-CM | POA: Insufficient documentation

## 2015-11-20 DIAGNOSIS — T7840XA Allergy, unspecified, initial encounter: Secondary | ICD-10-CM

## 2015-11-20 DIAGNOSIS — R600 Localized edema: Secondary | ICD-10-CM | POA: Insufficient documentation

## 2015-11-20 LAB — URINALYSIS, ROUTINE W REFLEX MICROSCOPIC
BILIRUBIN URINE: NEGATIVE
Glucose, UA: NEGATIVE mg/dL
KETONES UR: NEGATIVE mg/dL
NITRITE: NEGATIVE
PROTEIN: NEGATIVE mg/dL
Specific Gravity, Urine: 1.016 (ref 1.005–1.030)
pH: 6 (ref 5.0–8.0)

## 2015-11-20 LAB — URINE MICROSCOPIC-ADD ON: Bacteria, UA: NONE SEEN

## 2015-11-20 LAB — POC URINE PREG, ED: PREG TEST UR: NEGATIVE

## 2015-11-20 MED ORDER — DIPHENHYDRAMINE HCL 50 MG/ML IJ SOLN
25.0000 mg | Freq: Once | INTRAMUSCULAR | Status: AC
  Administered 2015-11-20: 25 mg via INTRAVENOUS
  Filled 2015-11-20: qty 1

## 2015-11-20 MED ORDER — METHYLPREDNISOLONE SODIUM SUCC 125 MG IJ SOLR: 125.0000 mg | Freq: Once | INTRAMUSCULAR | Status: DC

## 2015-11-20 MED ORDER — ACETAMINOPHEN 500 MG PO TABS
1000.0000 mg | ORAL_TABLET | Freq: Once | ORAL | Status: AC
  Administered 2015-11-20: 1000 mg via ORAL
  Filled 2015-11-20: qty 2

## 2015-11-20 MED ORDER — PREDNISONE 20 MG PO TABS: 40.0000 mg | ORAL_TABLET | Freq: Every day | ORAL | 0 refills | Status: DC

## 2015-11-20 MED ORDER — PREDNISONE 20 MG PO TABS
40.0000 mg | ORAL_TABLET | Freq: Once | ORAL | Status: AC
  Administered 2015-11-20: 40 mg via ORAL
  Filled 2015-11-20: qty 2

## 2015-11-20 MED ORDER — FAMOTIDINE IN NACL 20-0.9 MG/50ML-% IV SOLN
20.0000 mg | Freq: Once | INTRAVENOUS | Status: AC
  Administered 2015-11-20: 20 mg via INTRAVENOUS
  Filled 2015-11-20: qty 50

## 2015-11-20 MED ORDER — RANITIDINE HCL 150 MG/10ML PO SYRP: 150.0000 mg | ORAL_SOLUTION | Freq: Once | ORAL | Status: DC

## 2015-11-20 NOTE — ED Notes (Signed)
Pt now reports that she has a cough started on Sunday and "felt like she had the flu." Also c/o blisters "all over body." reports PCP yesterday when she noted swelling of lips and swelling in perineum. PCP gave steroid shot, reports symptoms have not yet resolved.

## 2015-11-20 NOTE — ED Triage Notes (Signed)
Patient complains of tongue and lip swelling x 2 days following use of flagyl. Also complains of mild vaginal bleeding and swelling of labia after using gel for yeast infection. Patient in no distress, handling secretions and speaking complete sentences.

## 2015-11-20 NOTE — Discharge Instructions (Signed)
Please continue to take prednisone 40mg  daily with breakfast for four more days. You can keep taking benadryl as needed for your allergies. Make an appointment to follow up with your primary care provider.

## 2015-11-20 NOTE — ED Provider Notes (Signed)
[facility] DEPT Provider Note   CSN: [id number] Arrival date & time: 11/20/15  0802     History   Chief Complaint Chief Complaint  Patient presents with  . Allergic Reaction    HPI Regina Griffith is a 25 year old woman with history of asthma. Reports that she is not currently pregnant.   HPI She has been having lip and tongue swelling started yesterday. She has some discomfort with swallowing. She went to her PCP yesterday. She was given a steroid injection. It improved the swelling but has not resolved. She had a reaction to Flagyl tablet prior. She was given the gel recently. Last used the gel Sunday night to complete a 5 day course. She took fluconazole Monday night.   She has had a cough since Sunday. She reports her yellow sputum has blood streaks in it. She has myalgias. This has resolved. The cough remains. No sick contacts.   She has labial itching and swelling. She has had spotting. She has abdominal cramping.  She has not had her flu shot this year.  Past Medical History:  Diagnosis Date  . Asthma   . Headache   . Pregnant   . Urticaria     Patient Active Problem List   Diagnosis Date Noted  . Chronic rhinitis 11/19/2015  . Mild intermittent asthma 11/19/2015  . Adverse drug reaction 11/19/2015    History reviewed. No pertinent surgical history.  OB History    Gravida Para Term Preterm AB Living   1             SAB TAB Ectopic Multiple Live Births                   Home Medications    Prior to Admission medications   Medication Sig Start Date End Date Taking? Authorizing Provider  albuterol (PROVENTIL HFA;VENTOLIN HFA) 108 (90 Base) MCG/ACT inhaler Inhale 1 puff into the lungs every 6 (six) hours as needed for wheezing or shortness of breath.   Yes Historical Provider, MD  diphenhydrAMINE (BENADRYL) 25 MG tablet Take 25 mg by mouth every 6 (six) hours as needed (allergic reaction).    Yes Historical Provider, MD  levocetirizine (XYZAL) 5  MG tablet Take 1 tablet (5 mg total) by mouth as needed for allergies. 11/19/15  Yes Cristal Fordalph Carter Bobbitt, MD  SUMAtriptan Succinate Refill (IMITREX STATDOSE REFILL) 6 MG/0.5ML SOCT Inject 1 Dose into the skin See admin instructions. One injection at migraine onset, may repeat after 2 hours if needed. No more than 2 doses in 24 hours. 06/28/14 11/19/16 Yes Historical Provider, MD  Azelastine-Fluticasone (DYMISTA) 137-50 MCG/ACT SUSP Place 1 spray into both nostrils 2 (two) times daily. Patient not taking: Reported on 11/20/2015 11/19/15   Cristal Fordalph Carter Bobbitt, MD  famotidine (PEPCID) 20 MG tablet Take 1 tablet (20 mg total) by mouth 2 (two) times daily. Patient not taking: Reported on 11/20/2015 04/09/15   Charlestine Nighthristopher Lawyer, PA-C  fluconazole (DIFLUCAN) 150 MG tablet Take 150 mg by mouth once. 11/18/15   Historical Provider, MD  predniSONE (DELTASONE) 20 MG tablet Take 2 tablets (40 mg total) by mouth daily with breakfast. 11/20/15   Lora PaulaJennifer T Zachary Lovins, MD  traMADol (ULTRAM) 50 MG tablet Take 1 tablet (50 mg total) by mouth every 6 (six) hours as needed. Patient not taking: Reported on 11/20/2015 08/31/15   Carlene CoriaSerena Y Sam, PA-C    Family History Family History  Problem Relation Age of Onset  . Migraines Mother  Social History Social History  Substance Use Topics  . Smoking status: Never Smoker  . Smokeless tobacco: Never Used  . Alcohol use No     Allergies   Azithromycin; Flagyl [metronidazole]; Coconut flavor; Fluconazole; Clindamycin/lincomycin; Latex; and Penicillins   Review of Systems Review of Systems Constitutional: no fevers, +chills Eyes: no vision changes Ears, nose, mouth, throat, and face: +cough Respiratory: no shortness of breath Cardiovascular: +chest pain with deep breaths similar to previous asthma exascerbations Gastrointestinal: no nausea/vomiting, +abdominal pain, no constipation, no diarrhea Genitourinary: +dysuria, no hematuria Integument: no  rash Hematologic/lymphatic: no bleeding/bruising Musculoskeletal: +back pain, +myalgias Neurological: no paresthesias, no weakness   Physical Exam Updated Vital Signs BP 107/74   Pulse 80   Temp 98.4 F (36.9 C) (Oral)   Resp 18   Ht 5\' 8"  (1.727 m)   Wt 68 kg   LMP 06/19/2015   SpO2 99%   BMI 22.81 kg/m   Physical Exam General Apperance: NAD Head: Normocephalic, atraumatic Eyes: PERRL, EOMI, anicteric sclera Ears: Normal external ear canal Nose: Nares normal, septum midline, mucosa normal Throat: Lips appear swollen, tongue normal, mild erythema of throat without exudate Neck: Supple, trachea midline Back: No tenderness or bony abnormality  Lungs: Clear to auscultation bilaterally. No wheezes, rhonchi or rales. Breathing comfortably Chest Wall: Nontender, no deformity Heart: Regular rate and rhythm, no murmur/rub/gallop Abdomen: Soft, nontender, nondistended, no rebound/guarding Extremities: Normal, atraumatic, warm and well perfused, no edema Pulses: 2+ throughout Skin: Mild 1cm area of erythema of right foot toe Neurologic: Alert and oriented x 3. CNII-XII intact. Normal strength and sensation   ED Treatments / Results  Labs (all labs ordered are listed, but only abnormal results are displayed) Labs Reviewed  URINALYSIS, ROUTINE W REFLEX MICROSCOPIC (NOT AT [facility]) - Abnormal; Notable for the following:       Result Value   Hgb urine dipstick MODERATE (*)    Leukocytes, UA SMALL (*)    All other components within normal limits  URINE MICROSCOPIC-ADD ON - Abnormal; Notable for the following:    Squamous Epithelial / LPF 0-5 (*)    All other components within normal limits  POC URINE PREG, ED   No results found.  Procedures   Medications Ordered in ED Medications  diphenhydrAMINE (BENADRYL) injection 25 mg (25 mg Intravenous Given 11/20/15 1018)  acetaminophen (TYLENOL) tablet 1,000 mg (1,000 mg Oral Given 11/20/15 1010)  famotidine (PEPCID) IVPB 20 mg  premix (20 mg Intravenous New Bag/Given 11/20/15 1020)  predniSONE (DELTASONE) tablet 40 mg (40 mg Oral Given 11/20/15 1010)     Initial Impression / Assessment and Plan / ED Course  I have reviewed the triage vital signs and the nursing notes.  Pertinent labs & imaging results that were available during my care of the patient were reviewed by me and considered in my medical decision making (see chart for details).  Clinical Course  9:00am evaluated pt 11:20am reports improvement in her symptoms  Final Clinical Impressions(s) / ED Diagnoses   Final diagnoses:  Allergic reaction, initial encounter  Recently used metronidazole gel and has a history of allergic reaction to oral metronidazole. Also recently took fluconazole. Presented with lip swelling. Has received a dose of IM steroid at PCP office yesterday morning. She was given IV diphenhydramine, famotidine, and PO prednisone here. Will give her a prescription for 40mg  prednisone daily for 4 more days. Follow up with PCP and allergy clinic.  New Prescriptions New Prescriptions   PREDNISONE (DELTASONE) 20 MG TABLET  Take 2 tablets (40 mg total) by mouth daily with breakfast.     Lora Paula, MD 11/20/15 1119    Lora Paula, MD 11/20/15 1125    Gwyneth Sprout, MD 11/20/15 [phone]

## 2015-11-21 ENCOUNTER — Ambulatory Visit (INDEPENDENT_AMBULATORY_CARE_PROVIDER_SITE_OTHER): Payer: PRIVATE HEALTH INSURANCE | Admitting: Allergy

## 2015-11-21 ENCOUNTER — Encounter: Payer: Self-pay | Admitting: Diagnostic Neuroimaging

## 2015-11-21 ENCOUNTER — Encounter: Payer: Self-pay | Admitting: Allergy

## 2015-11-21 VITALS — BP 100/70 | HR 75 | Temp 98.7°F | Resp 16

## 2015-11-21 DIAGNOSIS — T887XXD Unspecified adverse effect of drug or medicament, subsequent encounter: Secondary | ICD-10-CM

## 2015-11-21 DIAGNOSIS — T50905D Adverse effect of unspecified drugs, medicaments and biological substances, subsequent encounter: Secondary | ICD-10-CM

## 2015-11-21 MED ORDER — FAMOTIDINE 20 MG PO TABS: 20.0000 mg | ORAL_TABLET | Freq: Two times a day (BID) | ORAL | 5 refills | Status: DC

## 2015-11-21 NOTE — Patient Instructions (Addendum)
Adverse drug reaction Bora's history suggests fluconazole drug reaction.  Strictly avoid the culprit medication class.  Continue your prednisone course as prescribed by the ED  Start taking Xyzal 5mg  and Pepcid 20mg  twice a day to help with swelling  Please see you Ob/gyn to address vaginal bleeding  Chronic rhinitis  Return for allergy skin testing in 2 weeks after having been off of antihistamines for at least 3 days.   Use Dymista (azelastine/fluticasone) nasal spray, 1 spray per nostril twice daily as needed.   Nasal saline lavage (NeilMed) as needed has been recommended   Take levocetirizine, 5 mg, may decrease to 1 tab daily once swelling has improved.   For thick post nasal drainage, nasal congestion, and/or sinus pressure, add guaifenesin 1200 mg (Mucinex Maximum Strength) plus/minus pseudoephedrine 120 mg  twice daily as needed with adequate hydration as discussed. Pseudoephedrine is only to be used for short-term relief of nasal/sinus congestion. Long-term use is discouraged due to potential side effects.  Mild intermittent asthma  Continue albuterol HFA, 1-2 inhalations every 4-6 hours as needed. Asthma control goals:   Full participation in all desired activities (may need albuterol before activity)  Albuterol use two time or less a week on average (not counting use with activity)  Cough interfering with sleep two time or less a month  Oral steroids no more than once a year  No hospitalizations     Return for allergy testing in 2 weeks once able to be off antihistamine x 3 days.

## 2015-11-21 NOTE — Progress Notes (Signed)
Follow-up Note  RE: Regina Griffith MRN: [id number] DOB: August 14, 1990 Date of Office Visit: 11/21/2015   History of present illness: Regina Griffith is a 25 y.o. female presenting today for continued swelling from possible allergic reaction.  She was seen in our office on October 17, 2 days ago by Dr. Nunzio Cobbs for chronic rhinitis, mild intermittent asthma and adverse drug reaction.  Her history suggested a fluconazole drug reaction and she was advised to avoid this medication.  She went to the emergency department yesterday for continued lip swelling.  She was prescribed prednisone taper x5 days as well as Benadryl and Pepcid.  She reports she only got prednisone from the pharmacy which she took 40mg  yesterday but has not taken today's dose.   She returns today for persistent lip swelling as well as vaginal swelling, bleeding and pain.      Review of systems: Review of Systems  Constitutional: Negative for chills, fever and malaise/fatigue.  HENT: Positive for congestion. Negative for sore throat.   Eyes: Negative for redness.  Respiratory: Negative for cough, shortness of breath and wheezing.   Cardiovascular: Negative for chest pain.  Gastrointestinal: Negative for nausea and vomiting.  Skin: Positive for itching.  Neurological: Negative for headaches.    All other systems negative unless noted above in HPI  Past medical/social/surgical/family history have been reviewed and are unchanged unless specifically indicated below.  No changes  Medication List:   Medication List       Accurate as of 11/21/15  9:46 AM. Always use your most recent med list.          albuterol 108 (90 Base) MCG/ACT inhaler Commonly known as:  PROVENTIL HFA;VENTOLIN HFA Inhale 1 puff into the lungs every 6 (six) hours as needed for wheezing or shortness of breath.   Azelastine-Fluticasone 137-50 MCG/ACT Susp Commonly known as:  DYMISTA Place 1 spray into both nostrils 2 (two) times daily.   diphenhydrAMINE 25 MG tablet Commonly known as:  BENADRYL Take 25 mg by mouth every 6 (six) hours as needed (allergic reaction).   famotidine 20 MG tablet Commonly known as:  PEPCID Take 1 tablet (20 mg total) by mouth 2 (two) times daily.   fluconazole 150 MG tablet Commonly known as:  DIFLUCAN Take 150 mg by mouth once.   IMITREX STATDOSE REFILL 6 MG/0.5ML Soct Generic drug:  SUMAtriptan Succinate Refill Inject 1 Dose into the skin See admin instructions. One injection at migraine onset, may repeat after 2 hours if needed. No more than 2 doses in 24 hours.   levocetirizine 5 MG tablet Commonly known as:  XYZAL Take 1 tablet (5 mg total) by mouth as needed for allergies.   predniSONE 20 MG tablet Commonly known as:  DELTASONE Take 2 tablets (40 mg total) by mouth daily with breakfast.   traMADol 50 MG tablet Commonly known as:  ULTRAM Take 1 tablet (50 mg total) by mouth every 6 (six) hours as needed.       Known medication allergies: Allergies  Allergen Reactions  . Azithromycin Anaphylaxis and Swelling  . Flagyl [Metronidazole] Anaphylaxis and Swelling  . Coconut Flavor Itching and Swelling    tongues  . Fluconazole Swelling    Lips   . Clindamycin/Lincomycin Rash  . Latex Itching and Rash  . Penicillins Rash    Has patient had a PCN reaction causing immediate rash, facial/tongue/throat swelling, SOB or lightheadedness with hypotension: Yes Has patient had a PCN reaction causing severe rash involving mucus membranes or  skin necrosis: No Has patient had a PCN reaction that required hospitalization No Has patient had a PCN reaction occurring within the last 10 years: No If all of the above answers are "NO", then may proceed with Cephalosporin use.      Physical examination: Blood pressure 100/70, pulse 75, temperature 98.7 F (37.1 C), temperature source Oral, resp. rate 16, last menstrual period 06/19/2015.  General: Alert, interactive, in no acute  distress. HEENT: TMs pearly gray, turbinates moderately edematous with clear discharge, post-pharynx non erythematous. Lower lip slightly edematous with dry hyperpigmentation at the vermillion border; no blistering or sloughing.  Hard palate with hyperpigmented appear macule without blistering Neck: Supple without lymphadenopathy. Lungs: Clear to auscultation without wheezing, rhonchi or rales. {no increased work of breathing. CV: Normal S1, S2 without murmurs. Abdomen: Nondistended, nontender. Skin: Warm and dry, without lesions or rashes. Extremities:  No clubbing, cyanosis or edema. Neuro:   Grossly intact.  Diagnositics/Labs: None today in office  Reviewed ED visit labs obtained 11/20/15--- UPT neg, UA with moderate hgb, small leukocytes  Assessment and plan:   Adverse drug reaction Kamera's history suggests fluconazole drug reaction.  Strictly avoid the culprit medication class.  Continue your prednisone (40mg  x 5 days) course as prescribed by the ED  Start taking Xyzal 5mg  and Pepcid 20mg  twice a day to help with swelling  Please see your Ob/gyn to address vaginal bleeding  Chronic rhinitis  Return for allergy skin testing in 2 weeks after having been off of antihistamines for at least 3 days.   Use Dymista (azelastine/fluticasone) nasal spray, 1 spray per nostril twice daily as needed.   Nasal saline lavage (NeilMed) as needed has been recommended   Take levocetirizine, 5 mg, may decrease to 1 tab daily once swelling has improved.   For thick post nasal drainage, nasal congestion, and/or sinus pressure, add guaifenesin 1200 mg (Mucinex Maximum Strength) plus/minus pseudoephedrine 120 mg  twice daily as needed with adequate hydration as discussed. Pseudoephedrine is only to be used for short-term relief of nasal/sinus congestion. Long-term use is discouraged due to potential side effects.  Mild intermittent asthma  Continue albuterol HFA, 1-2 inhalations every  4-6 hours as needed. Asthma control goals:   Full participation in all desired activities (may need albuterol before activity)  Albuterol use two time or less a week on average (not counting use with activity)  Cough interfering with sleep two time or less a month  Oral steroids no more than once a year  No hospitalizations    Return for allergy testing in 2 weeks once able to be off antihistamine x 3 days.   I appreciate the opportunity to take part in Anetta's care. Please do not hesitate to contact me with questions.  Sincerely,   Margo AyeShaylar Tryniti Laatsch, MD Allergy/Immunology [facility]

## 2015-11-22 ENCOUNTER — Telehealth: Payer: Self-pay | Admitting: Allergy

## 2015-11-22 NOTE — Telephone Encounter (Signed)
No she does not need to bring in her uniform.  If they are changing uniforms then that should resolve any contact or irritant reaction related to the uniform.  Zpak should not have any effects related to skin rash.

## 2015-11-22 NOTE — Telephone Encounter (Signed)
Please advise 

## 2015-11-22 NOTE — Telephone Encounter (Signed)
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 She said they are having trouble with people breaking out with blister's, throwing up blood, etc. From wearing the uniforms. She has blister's on her legs and lips. Wants to know if she needs to bring in the uniform for testing. She was also on a Zpac and wants to know if that could be affecting her.

## 2015-11-25 ENCOUNTER — Ambulatory Visit: Payer: 59 | Admitting: Allergy and Immunology

## 2015-11-25 NOTE — Telephone Encounter (Signed)
Advised pt that the Z-pack should not have caused those symptoms. Pt states that her lip and tongue were no longer swollen. Advised pt to give us a call back if she has anymore symptoms with itching/swelling. She was able to change her uniform at work.

## 2015-11-27 ENCOUNTER — Telehealth: Payer: Self-pay | Admitting: *Deleted

## 2015-11-27 NOTE — Telephone Encounter (Signed)
Pt Physician Cristie Hem(Desiree Duby at [facility]) called requesting a return call from Dr.Bobbitt. Her personal phone number is [phone], office number is [phone]

## 2015-11-28 NOTE — Telephone Encounter (Signed)
I tried to call Regina BoerDesiree Griffith but there was answer. I left a message asking her to return my call at her convenience.

## 2015-11-29 NOTE — Telephone Encounter (Signed)
Pt was advsd on Thursday, Oct. 26th by [facility] appt w/ Dr. Nunzio CobbsBobbitt has been sched for Tuesday Oct. 31st at 10:30 am.

## 2015-12-03 ENCOUNTER — Ambulatory Visit: Payer: PRIVATE HEALTH INSURANCE | Admitting: Allergy and Immunology

## 2015-12-08 ENCOUNTER — Emergency Department (HOSPITAL_COMMUNITY)
Admission: EM | Admit: 2015-12-08 | Discharge: 2015-12-08 | Disposition: A | Discharge status: Home / Self Care | Payer: 59 | Attending: Emergency Medicine | Admitting: Emergency Medicine

## 2015-12-08 ENCOUNTER — Encounter (HOSPITAL_COMMUNITY): Payer: Self-pay

## 2015-12-08 DIAGNOSIS — J45909 Unspecified asthma, uncomplicated: Secondary | ICD-10-CM | POA: Diagnosis not present

## 2015-12-08 DIAGNOSIS — Z91040 Latex allergy status: Secondary | ICD-10-CM | POA: Diagnosis not present

## 2015-12-08 DIAGNOSIS — L02411 Cutaneous abscess of right axilla: Secondary | ICD-10-CM

## 2015-12-08 DIAGNOSIS — L0231 Cutaneous abscess of buttock: Secondary | ICD-10-CM | POA: Diagnosis present

## 2015-12-08 MED ORDER — LIDOCAINE-EPINEPHRINE-TETRACAINE (LET) SOLUTION
3.0000 mL | Freq: Once | NASAL | Status: AC
  Administered 2015-12-08: 3 mL via TOPICAL
  Filled 2015-12-08: qty 3

## 2015-12-08 MED ORDER — DIPHENHYDRAMINE HCL 25 MG PO CAPS
25.0000 mg | ORAL_CAPSULE | Freq: Once | ORAL | Status: AC
  Administered 2015-12-08: 25 mg via ORAL
  Filled 2015-12-08: qty 1

## 2015-12-08 MED ORDER — DOXYCYCLINE HYCLATE 100 MG PO CAPS: 100.0000 mg | ORAL_CAPSULE | Freq: Two times a day (BID) | ORAL | 0 refills | Status: DC

## 2015-12-08 MED ORDER — OXYCODONE-ACETAMINOPHEN 5-325 MG PO TABS: 1.0000 | ORAL_TABLET | ORAL | 0 refills | Status: DC | PRN

## 2015-12-08 MED ORDER — LIDOCAINE HCL (PF) 1 % IJ SOLN
INTRAMUSCULAR | Status: AC
  Filled 2015-12-08: qty 5

## 2015-12-08 MED ORDER — OXYCODONE-ACETAMINOPHEN 5-325 MG PO TABS
1.0000 | ORAL_TABLET | Freq: Once | ORAL | Status: AC
  Administered 2015-12-08: 1 via ORAL
  Filled 2015-12-08: qty 1

## 2015-12-08 MED ORDER — LIDOCAINE HCL (PF) 1 % IJ SOLN
5.0000 mL | Freq: Once | INTRAMUSCULAR | Status: AC
  Administered 2015-12-08: 5 mL

## 2015-12-08 NOTE — ED Provider Notes (Signed)
[facility]PT Provider Note   CSN: [id number] Arrival date & time: 12/08/15  [id number]     History   Chief Complaint Chief Complaint  Patient presents with  . Abscess    HPI Regina Griffith is a 25 y.o. female.  Patient presents with complaint of painful swollen areas to right axilla and right medial buttock for the past several days. No fever. She reports the axillary area is more painful. No drainage from either area. She also reports she has persistent vaginal discharge previously diagnosed as trichomonas. She has significant antibiotics allergies and cannot take Flagyl as she had a recent reaction, specifically swelling to lips and tongue. She is pending an appointment with infectious disease, managed by her doctor in the outpatient setting. No nausea or vomiting. No history of recurrent abscesses.   The history is provided by the patient. No language interpreter was used.  Abscess  Associated symptoms: no fever, no nausea and no vomiting     Past Medical History:  Diagnosis Date  . Asthma   . Headache   . Pregnant   . Urticaria     Patient Active Problem List   Diagnosis Date Noted  . Chronic rhinitis 11/19/2015  . Mild intermittent asthma 11/19/2015  . Adverse drug reaction 11/19/2015    History reviewed. No pertinent surgical history.  OB History    Gravida Para Term Preterm AB Living   1             SAB TAB Ectopic Multiple Live Births                   Home Medications    Prior to Admission medications   Medication Sig Start Date End Date Taking? Authorizing Provider  albuterol (PROVENTIL HFA;VENTOLIN HFA) 108 (90 Base) MCG/ACT inhaler Inhale 1 puff into the lungs every 6 (six) hours as needed for wheezing or shortness of breath.   Yes Historical Provider, MD  Azelastine-Fluticasone (DYMISTA) 137-50 MCG/ACT SUSP Place 1 spray into both nostrils 2 (two) times daily. Patient not taking: Reported on 12/08/2015 11/19/15   Cristal Fordalph Carter Bobbitt, MD    famotidine (PEPCID) 20 MG tablet Take 1 tablet (20 mg total) by mouth 2 (two) times daily. Patient not taking: Reported on 12/08/2015 04/09/15   Charlestine Nighthristopher Lawyer, PA-C  famotidine (PEPCID) 20 MG tablet Take 1 tablet (20 mg total) by mouth 2 (two) times daily. Patient not taking: Reported on 12/08/2015 11/21/15   Shaylar Larose HiresPatricia Padgett, MD  levocetirizine (XYZAL) 5 MG tablet Take 1 tablet (5 mg total) by mouth as needed for allergies. Patient not taking: Reported on 12/08/2015 11/19/15   Cristal Fordalph Carter Bobbitt, MD  predniSONE (DELTASONE) 20 MG tablet Take 2 tablets (40 mg total) by mouth daily with breakfast. Patient not taking: Reported on 12/08/2015 11/20/15   Lora PaulaJennifer T Krall, MD  traMADol (ULTRAM) 50 MG tablet Take 1 tablet (50 mg total) by mouth every 6 (six) hours as needed. Patient not taking: Reported on 12/08/2015 08/31/15   Carlene CoriaSerena Y Sam, PA-C    Family History Family History  Problem Relation Age of Onset  . Migraines Mother     Social History Social History  Substance Use Topics  . Smoking status: Never Smoker  . Smokeless tobacco: Never Used  . Alcohol use No     Allergies   Azithromycin; Flagyl [metronidazole]; Coconut flavor; Fluconazole; Clindamycin/lincomycin; Latex; and Penicillins   Review of Systems Review of Systems  Constitutional: Negative for chills and fever.  Gastrointestinal: Negative.  Negative for blood in stool, nausea and vomiting.  Genitourinary: Positive for vaginal discharge.  Musculoskeletal: Negative.  Negative for myalgias.  Skin: Positive for wound.  Neurological: Negative.      Physical Exam Updated Vital Signs BP 104/62 (BP Location: Left Arm)   Pulse 98   Temp 99 F (37.2 C) (Oral)   Resp 18   LMP 06/19/2015   SpO2 99%   Physical Exam  Constitutional: She is oriented to person, place, and time. She appears well-developed and well-nourished.  Neck: Normal range of motion.  Pulmonary/Chest: Effort normal.  Neurological: She is  alert and oriented to person, place, and time.  Skin: Skin is warm and dry.  Large fluctuant abscess to right axilla. Markedly tender. There is an indurated area to medial buttock c/w forming abscess. No tenderness to anus.      ED Treatments / Results  Labs (all labs ordered are listed, but only abnormal results are displayed) Labs Reviewed - No data to display  EKG  EKG Interpretation None      INCISION AND DRAINAGE Performed by: Elpidio AnisUPSTILL, Monicka Cyran A Consent: Verbal consent obtained. Risks and benefits: risks, benefits and alternatives were discussed Type: abscess  Body area: right axilla  Anesthesia: local infiltration  Incision was made with a scalpel.  Local anesthetic: lidocaine 1% w/o, epinephrine, L.E.T.  Anesthetic total: 2 ml  Complexity: complex Blunt dissection to break up loculations  Drainage: purulent  Drainage amount: large, purulent  Packing material: none  Patient tolerance: Patient tolerated the procedure well with no immediate complications.    Radiology No results found.  Procedures Procedures (including critical care time)  Medications Ordered in ED Medications  diphenhydrAMINE (BENADRYL) capsule 25 mg (not administered)  oxyCODONE-acetaminophen (PERCOCET/ROXICET) 5-325 MG per tablet 1 tablet (not administered)  lidocaine-EPINEPHrine-tetracaine (LET) solution (not administered)     Initial Impression / Assessment and Plan / ED Course  I have reviewed the triage vital signs and the nursing notes.  Pertinent labs & imaging results that were available during my care of the patient were reviewed by me and considered in my medical decision making (see chart for details).  Clinical Course     Patient with abscesses x 2 with right axillary area requiring I&D. Procedure done as per procedure note. Patient's pain addressed.   She has been referred to Infectious Disease for treatment recommendations for trich which hasn't occurred yet. She  is requesting referral to expedite further evaluation.  Final Clinical Impressions(s) / ED Diagnoses   Final diagnoses:  None   1. Right axillary abscess 2. History of persistent trichomonas vaginalis  New Prescriptions New Prescriptions   No medications on file     Elpidio AnisShari Ingris Pasquarella, PA-C 12/18/15 2015    Gilda Creasehristopher J Pollina, MD 12/19/15 [id number]

## 2015-12-08 NOTE — Discharge Instructions (Signed)
Warm compresses to the right underarm and right buttock. Please take the antibiotic as directed. Follow up with Infectious Disease as planned by your doctor for treatment of vaginal infection. Follow up with your doctor for recheck of abscesses in 2 days.

## 2015-12-08 NOTE — ED Triage Notes (Signed)
Pt states that she has two abscess one on her bottom and one under her R arm. Pt states that she was seen here earlier this month, was not placed on ABT due to allergies. Pt states that she needs to speak with infectious diease.

## 2015-12-09 ENCOUNTER — Telehealth: Payer: Self-pay

## 2015-12-09 NOTE — Telephone Encounter (Signed)
Crystal from [facility] called to see if we were going to send the patient to an infectious disease doctor. I spoke with Dr Nunzio CobbsBobbitt and he would like the PCP to go ahead and send her to ID doctor. If they are not able to do so, we will do so. Crystal will contact me if they can not do the Referral.   Note:  Patient has been to the ED, PCP, [facility] & [facility].

## 2015-12-10 ENCOUNTER — Telehealth: Payer: Self-pay | Admitting: Allergy and Immunology

## 2015-12-10 NOTE — Telephone Encounter (Signed)
Left message for patient to call office.  The boils she is having is not allergic reaction but infection (boils) she can ask her PCP or GYN MD to call but we have never addressed boils and cannot get her referred any sooner. If she is having problems before then she may want to try to get see Dermatology

## 2015-12-10 NOTE — Telephone Encounter (Signed)
Patient called and said she is a Dr. Nunzio CobbsBobbitt patient. I looked in her chart and she last saw Dr. Delorse LekPadgett on 11-21-15. She is having and allergic reaction. She has been to the ER and had a boil cut off her leg. They had her call the CDC to get in for an appt with them. They can only see her in December. They told her if our office called them, they would get her in sooner. She says she now has a boil under her arm and between her breasts. The hospital gave her doxycycline hycote 100 mg. She said she is having the same reaction as to when she was given that before. The number for the CDC is [phone].

## 2015-12-12 NOTE — Telephone Encounter (Signed)
Inform patient to call her GYN to get them to try to move her appointment up.

## 2015-12-14 ENCOUNTER — Emergency Department (HOSPITAL_COMMUNITY)
Admission: EM | Admit: 2015-12-14 | Discharge: 2015-12-15 | Disposition: A | Discharge status: Home / Self Care | Payer: 59 | Attending: Emergency Medicine | Admitting: Emergency Medicine

## 2015-12-14 ENCOUNTER — Encounter (HOSPITAL_COMMUNITY): Payer: Self-pay | Admitting: *Deleted

## 2015-12-14 DIAGNOSIS — J45909 Unspecified asthma, uncomplicated: Secondary | ICD-10-CM | POA: Insufficient documentation

## 2015-12-14 DIAGNOSIS — Z91040 Latex allergy status: Secondary | ICD-10-CM | POA: Diagnosis not present

## 2015-12-14 DIAGNOSIS — L02411 Cutaneous abscess of right axilla: Secondary | ICD-10-CM | POA: Insufficient documentation

## 2015-12-14 DIAGNOSIS — L0889 Other specified local infections of the skin and subcutaneous tissue: Secondary | ICD-10-CM | POA: Diagnosis not present

## 2015-12-14 DIAGNOSIS — L089 Local infection of the skin and subcutaneous tissue, unspecified: Secondary | ICD-10-CM

## 2015-12-14 DIAGNOSIS — R21 Rash and other nonspecific skin eruption: Secondary | ICD-10-CM | POA: Diagnosis present

## 2015-12-14 NOTE — ED Notes (Signed)
Patient ambulated to the room. No acute distress noted 

## 2015-12-14 NOTE — ED Triage Notes (Signed)
She states she currently has a bacterial infection and her doctor is sending her to an infectious disease doctor the first of december

## 2015-12-14 NOTE — ED Provider Notes (Signed)
[facility] Provider Note   CSN: [id number] Arrival date & time: 12/14/15  2236     History   Chief Complaint Chief Complaint  Patient presents with  . Allergic Reaction    HPI Regina Griffith is a 25 y.o. female with a hx of asthma presents to the Emergency Department complaining of gradual, persistent, progressively worsening rash onset 2 days ago. She states that on 12/08/2015 she took one dose of doxycycline and vomited the entire contents of her stomach 5 minutes later. She believes that this rash is allergic reaction to the doxycycline. She reports she has not taken any additional doses. Associated symptoms include pain at the site of the blisters.  Pt is taking benadryl and percocet which helps alleviate some of the symptoms.  Nothing seems to make them worse. 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  Pt denies fever, chills, headache, neck pain, chest pain, SOB, abd pain, N/V/D, weakness, dizziness, syncope.  She reports the abscess to the buttock is healing.     Record review shows pt was evaluated in the ED on 11/5 for same.      The history is provided by the patient and medical records. No language interpreter was used.    Past Medical History:  Diagnosis Date  . Asthma   . Headache   . Pregnant   . Urticaria     Patient Active Problem List   Diagnosis Date Noted  . Chronic rhinitis 11/19/2015  . Mild intermittent asthma 11/19/2015  . Adverse drug reaction 11/19/2015    History reviewed. No pertinent surgical history.  OB History    Gravida Para Term Preterm AB Living   1             SAB TAB Ectopic Multiple Live Births                   Home Medications    Prior to Admission medications   Medication Sig Start Date End Date Taking? Authorizing Provider  albuterol (PROVENTIL HFA;VENTOLIN HFA) 108 (90 Base) MCG/ACT inhaler Inhale 1 puff into the lungs every 6 (six) hours as needed for wheezing  or shortness of breath.   Yes Historical Provider, MD  diphenhydrAMINE (BENADRYL) 25 MG tablet Take 25-50 mg by mouth every 6 (six) hours as needed for allergies.   Yes Historical Provider, MD  Norethin Ace-Eth Estrad-FE (TAYTULLA) 1-20 MG-MCG(24) CAPS Take 1 tablet by mouth.   Yes Historical Provider, MD  oxyCODONE-acetaminophen (PERCOCET/ROXICET) 5-325 MG tablet Take 1 tablet by mouth every 4 (four) hours as needed for severe pain. Patient taking differently: Take 0.5-1 tablets by mouth every 4 (four) hours as needed for severe pain.  12/08/15  Yes Elpidio Anis, PA-C  SUMAtriptan (IMITREX) 6 MG/0.5ML SOLN injection Inject 6 mg into the skin every 2 (two) hours as needed for migraine or headache. May repeat in 2 hours if headache persists or recurs.   Yes Historical Provider, MD  chlorhexidine (HIBICLENS) 4 % external liquid Apply topically daily as needed. 12/15/15   Charlesa Ehle, PA-C  mupirocin cream (BACTROBAN) 2 % Apply 1 application topically 2 (two) times daily. 12/15/15   Dahlia Client Simran Mannis, PA-C    Family History Family History  Problem Relation Age of Onset  . Migraines Mother     Social History Social History  Substance Use Topics  . Smoking status: Never Smoker  . Smokeless tobacco: Never Used  . Alcohol use No  Allergies   Azithromycin; Flagyl [metronidazole]; Coconut flavor; Doxycycline; Fluconazole; Clindamycin/lincomycin; Latex; and Penicillins   Review of Systems Review of Systems  Skin: Positive for rash.  All other systems reviewed and are negative.    Physical Exam Updated Vital Signs BP 118/86 (BP Location: Left Arm)   Pulse 75   Temp 98 F (36.7 C) (Oral)   Resp 18   Ht 5\' 8"  (1.727 m)   Wt 69.2 kg   LMP 06/19/2015   SpO2 99%   BMI 23.19 kg/m   Physical Exam  Constitutional: She is oriented to person, place, and time. She appears well-developed and well-nourished. No distress.  Awake, alert, nontoxic appearance  HENT:  Head:  Normocephalic and atraumatic.  Mouth/Throat: Oropharynx is clear and moist. No oropharyngeal exudate.  No blisters of the mucosa, no petechiae.    Eyes: Conjunctivae are normal. No scleral icterus.  Neck: Normal range of motion. Neck supple.  Cardiovascular: Normal rate, regular rhythm and intact distal pulses.   Pulmonary/Chest: Effort normal and breath sounds normal. No respiratory distress. She has no wheezes.  Equal chest expansion  Abdominal: Soft. Bowel sounds are normal. She exhibits no distension and no mass. There is no tenderness. There is no rebound and no guarding.  Genitourinary:  Genitourinary Comments: Right buttock: small area of induration without central fluctuance. No perirectal extension  Musculoskeletal: Normal range of motion. She exhibits no edema.  Lymphadenopathy:    She has no cervical adenopathy.  Neurological: She is alert and oriented to person, place, and time.  Speech is clear and goal oriented Moves extremities without ataxia  Skin: Skin is warm and dry. She is not diaphoretic. There is erythema.  Right Axilla: area of healing abscess with previous I&D, small area of erythema and induration, scattered pustular rash to the right axilla Left 3rd toe: large, ruptured blister, dry Right foot: dry and scaling, no open lesions, no vesicles Left flank: 1 ruptured pustule  Psychiatric: She has a normal mood and affect.  Nursing note and vitals reviewed.    ED Treatments / Results   Procedures Procedures (including critical care time)  Medications Ordered in ED Medications - No data to display   Initial Impression / Assessment and Plan / ED Course  I have reviewed the triage vital signs and the nursing notes.  Pertinent labs & imaging results that were available during my care of the patient were reviewed by me and considered in my medical decision making (see chart for details).  Clinical Course as of Dec 14 120  Sat Dec 14, 2015  2312 afebrile  Temp: 98 F (36.7 C) [HM]  2313 VSS, no tachycardia BP: 118/86 [HM]  Sun Dec 15, 2015  0121 The patient was discussed with and seen by Dr. Wilkie AyeHorton who agrees with the treatment plan.   [HM]    Clinical Course User Index [HM] Dahlia ClientHannah Carlota Philley, PA-C    Pt presents with Rash and abscess to the right axilla. Rash is pustular and is not vesicular. No oral lesions. No reason to suspect Stevens-Johnson syndrome. No urticaria to suggest allergic reaction. Patient did not consume even 1 full dose of the doxycycline.  She is well appearing and afebrile. Discussed at length the importance of keeping her skin clean. We will give Hibiclens and Bactroban and an effort to decrease the pustules. I discussed the importance of close follow-up with infectious disease and her allergist. Patient has an appointment with infectious disease inseveral weeks.  Final Clinical Impressions(s) / ED  Diagnoses   Final diagnoses:  Skin pustule  Abscess of right axilla    New Prescriptions New Prescriptions   CHLORHEXIDINE (HIBICLENS) 4 % EXTERNAL LIQUID    Apply topically daily as needed.   MUPIROCIN CREAM (BACTROBAN) 2 %    Apply 1 application topically 2 (two) times daily.     Dahlia ClientHannah Daziya Redmond, PA-C 12/15/15 0122    Shon Batonourtney F Horton, MD 12/15/15 [phone]

## 2015-12-14 NOTE — ED Triage Notes (Signed)
The pt is c/o having a boil under her rt arm 1 1/2 weeks ago  She was given docycycline she was only able to take one pill and she started vomiting she has pain in her rt axill again and the pain goes down her rt arm  She feels like the  Infection is spreading.  She also has a n allergy to flagyl

## 2015-12-15 ENCOUNTER — Telehealth (HOSPITAL_BASED_OUTPATIENT_CLINIC_OR_DEPARTMENT_OTHER): Payer: Self-pay

## 2015-12-15 MED ORDER — CHLORHEXIDINE GLUCONATE 4 % EX LIQD: Freq: Every day | CUTANEOUS | 0 refills | Status: DC | PRN

## 2015-12-15 MED ORDER — MUPIROCIN CALCIUM 2 % EX CREA: 1.0000 "application " | TOPICAL_CREAM | Freq: Two times a day (BID) | CUTANEOUS | 0 refills | Status: DC

## 2015-12-15 NOTE — Discharge Instructions (Signed)
1. Medications: hibiclens, bactroban, usual home medications 2. Treatment: rest, drink plenty of fluids, keep skin with clean and dry 3. Follow Up: Please followup with your primary doctor, allergist and infectious disease at your already scheduled appointment for discussion of your diagnoses and further evaluation after today's visit; if you do not have a primary care doctor use the resource guide provided to find one; Please return to the ER for fever, chills, persistent vomiting or other cocnerns

## 2015-12-15 NOTE — ED Notes (Signed)
Patient Alert and oriented X4. Stable and ambulatory. Patient verbalized understanding of the discharge instructions.  Patient belongings were taken by the patient.  

## 2015-12-23 ENCOUNTER — Ambulatory Visit: Payer: 59 | Admitting: Allergy and Immunology

## 2016-01-09 ENCOUNTER — Ambulatory Visit (INDEPENDENT_AMBULATORY_CARE_PROVIDER_SITE_OTHER): Payer: PRIVATE HEALTH INSURANCE | Admitting: Internal Medicine

## 2016-01-09 ENCOUNTER — Encounter: Payer: Self-pay | Admitting: Internal Medicine

## 2016-01-09 DIAGNOSIS — G44029 Chronic cluster headache, not intractable: Secondary | ICD-10-CM

## 2016-01-09 DIAGNOSIS — Z8759 Personal history of other complications of pregnancy, childbirth and the puerperium: Secondary | ICD-10-CM | POA: Diagnosis not present

## 2016-01-09 DIAGNOSIS — G8929 Other chronic pain: Secondary | ICD-10-CM | POA: Insufficient documentation

## 2016-01-09 DIAGNOSIS — G43009 Migraine without aura, not intractable, without status migrainosus: Secondary | ICD-10-CM

## 2016-01-09 DIAGNOSIS — G43909 Migraine, unspecified, not intractable, without status migrainosus: Secondary | ICD-10-CM | POA: Insufficient documentation

## 2016-01-09 DIAGNOSIS — R51 Headache: Secondary | ICD-10-CM

## 2016-01-09 NOTE — Progress Notes (Signed)
RFV: metronidazole allergy  Patient ID: Regina Griffith, female   DOB: 06/16/1990, 25 y.o.   MRN: [id number]  HPI  Regina Griffith is a 25yo F with history of chronic headache/migrianes, asthma, allergic rhinitis, who reports having recurrence of trichomonas infection with vaginal itching, foul smelling discharge at times may have metronidazole and fluconazole drug allergies. She is referred to us by her allergist, Dr Delorse LekPadgett. Over the last few months, she has been treated with metronidazole  intra-vaginal gel/suppository which had delayed effect of possible throat swelling, lip swelling, ulcer to lip and tongue. She is an Arts administratorairline stewardess and [place], for which they were concerned for stevens-johnsons vs herpetic outbreak. She reports having lip swelling in mid October for which she went to the emergency room for possible drug reaction from fluconazole, where she received steroid taper and followed up with Dr. Delorse LekPadgett on Oct 19th. The plan from that visit was to have her return in a few weeks for allergy testing off of antihistamines and steroids.   The patient has not been back to allergy for the follow up testing in part due to her work schedule as being a flight attendant and also was under the impression that this would be done during this ID visit.  She did follow up with Ob/Gyn in late October where she mentioend she had reaction to metrogel and fluconazole. She was diagnosed to ongoing trichomonas and bacterila vaginosis and referred back to allergy for desensitization though patient may have not done that yet  She had been tested for hiv, hsv 1, hsv 2, and hep B all of which were negative  The patient denies throat swelling, lip swelling, or ulcers at this time. Occasional vaginal discharge with odor. She does subscribe to ongoing daily headache, bilaterally. She has not used imitrex but does not appear to be on anything for daily  management of headache other than prn goody powder     Outpatient Encounter Prescriptions as of 01/09/2016  Medication Sig  . albuterol (PROVENTIL HFA;VENTOLIN HFA) 108 (90 Base) MCG/ACT inhaler Inhale 1 puff into the lungs every 6 (six) hours as needed for wheezing or shortness of breath.  . Azelastine-Fluticasone 137-50 MCG/ACT SUSP Place into the nose.  . diphenhydrAMINE (BENADRYL) 25 MG tablet Take 25-50 mg by mouth every 6 (six) hours as needed for allergies.  Marland Kitchen. levocetirizine (XYZAL) 5 MG tablet   . mupirocin cream (BACTROBAN) 2 % Apply 1 application topically 2 (two) times daily.  . Norethin Ace-Eth Estrad-FE (TAYTULLA) 1-20 MG-MCG(24) CAPS Take 1 tablet by mouth.  . ondansetron (ZOFRAN) 8 MG tablet   . SUMAtriptan (IMITREX) 6 MG/0.5ML SOLN injection Inject 6 mg into the skin every 2 (two) hours as needed for migraine or headache. May repeat in 2 hours if headache persists or recurs.  . traMADol (ULTRAM) 50 MG tablet   . chlorhexidine (HIBICLENS) 4 % external liquid Apply topically daily as needed. (Patient not taking: Reported on 01/09/2016)  . oxyCODONE-acetaminophen (PERCOCET/ROXICET) 5-325 MG tablet Take 1 tablet by mouth every 4 (four) hours as needed for severe pain. (Patient not taking: Reported on 01/09/2016)   No facility-administered encounter medications on file as of 01/09/2016.      Patient Active Problem List   Diagnosis Date Noted  . Chronic headache disorder 01/09/2016  . Migraine 01/09/2016  . Chronic rhinitis 11/19/2015  . Mild intermittent asthma 11/19/2015  . Adverse drug reaction 11/19/2015     Health Maintenance Due  Topic Date Due  . HIV Screening  07/07/2004  . TETANUS/TDAP  07/07/2008  . PAP SMEAR  07/08/2010  . INFLUENZA VACCINE  09/03/2015    Social History  Substance Use Topics  . Smoking status: Never Smoker  . Smokeless tobacco: Never Used  . Alcohol use No  family history includes Migraines in her mother. Review of Systems Per  hpi Physical Exam   BP 113/77   Pulse 87   Temp 98.8 F (37.1 C) (Oral)   Wt 148 lb (67.1 kg)   LMP 12/26/2015 (Approximate)   BMI 22.50 kg/m  Physical Exam  Constitutional:  oriented to person, place, and time. appears well-developed and well-nourished. No distress.  HENT: [place]/AT, PERRLA, no scleral icterus Mouth/Throat: Oropharynx is clear and moist. No oropharyngeal exudate.  Cardiovascular: Normal rate, regular rhythm and normal heart sounds. Exam reveals no gallop and no friction rub.  No murmur heard.  Pulmonary/Chest: Effort normal and breath sounds normal. No respiratory distress.  has no wheezes.  Neck = supple, no nuchal rigidity Abdominal: Soft. Bowel sounds are normal.  exhibits no distension. There is no tenderness.  Lymphadenopathy: no cervical adenopathy. No axillary adenopathy Neurological: alert and oriented to person, place, and time.  Skin: Skin is warm and dry. No rash noted. No erythema.  Psychiatric: a normal mood and affect.  behavior is normal.   CBC Lab Results  Component Value Date   WBC 6.6 08/31/2015   RBC 4.34 08/31/2015   HGB 13.5 08/31/2015   HCT 39.6 08/31/2015   PLT 281 08/31/2015   MCV 91.2 08/31/2015   MCH 31.1 08/31/2015   MCHC 34.1 08/31/2015   RDW 11.9 08/31/2015   BMET Lab Results  Component Value Date   NA 137 08/31/2015   K 3.8 08/31/2015   CL 108 08/31/2015   CO2 23 08/31/2015   GLUCOSE 93 08/31/2015   BUN 7 08/31/2015   CREATININE 0.77 08/31/2015   CALCIUM 9.2 08/31/2015   GFRNONAA >60 08/31/2015   GFRAA >60 08/31/2015     Assessment and Plan History of BV and trichomonas with flagyl allergy -- recommend that she goes back to allergist to see if they can do desensitization so that she can complete course of metronidazole.  I reached out to allegy office and she has visit on  - Dec 15th, 2017 @ 10:30 --t osee dr Delorse Lekpadgett  Also recommend to see [place] [facility] for migraine management

## 2016-01-09 NOTE — Patient Instructions (Signed)
You have an appointment with Dr. Delorse LekPadgett on Dec 15th @ 10:30 am for follow up on desensitization of metronidazole

## 2016-01-14 ENCOUNTER — Telehealth: Payer: Self-pay | Admitting: Allergy

## 2016-01-14 NOTE — Telephone Encounter (Signed)
Desiree from [facility] called to talk about desinsitizing this patient with Flagyle. Would like to speak to a nurse. [phone]. [phone]

## 2016-01-14 NOTE — Telephone Encounter (Signed)
[facility] called back at 4:40. She said this patient walked into their office without an appointment today and wanted to know about the desensitization. I read Dr. Randell PatientPadgett's note and the PA was not satisfied with the answer. She said she realizes this is not recommended, but she said this girls needs this done. She said if the appointment will not be beneficial to her on 12-14, we can just call the patient and tell her. Patient's number is : [phone].

## 2016-01-14 NOTE — Telephone Encounter (Signed)
See note again

## 2016-01-14 NOTE — Telephone Encounter (Addendum)
I spoke with the PA there (I do not recall her name) yesterday around 4pm regarding this patient.  I advised her yesterday that we recommended she see infectious disease to alternatives to flagyl.    I was told that she went to the [facility] who recommended desensitization however it doesn't appear that other alternatives to flagyl were discussed.  I discussed with the NP that flagyl desensitization is not something that is routinely done and is not office procedure.  It also has a poor success rate thus would not recommend this as an option. The PA yesterday agreed that desensitization is not an ideal option for her.   I do not think her appt on 12/14 will be beneficial if it's only to discuss desensitization.  I told the NP that she still needs to see ID ([facility] to provide other alternative treatments.     After review I see she did see Dr. Drue SecondSnider with ID however can not tell from her note if any alternatives to flagyl were discussed.

## 2016-01-14 NOTE — Telephone Encounter (Signed)
Dr Delorse LekPadgett if you can advise if this is something we might do.  I know all doctors have there preferences if you want to do this.  She has appt scheduled 12/14 with you

## 2016-01-16 ENCOUNTER — Ambulatory Visit: Payer: Self-pay | Admitting: Allergy

## 2016-01-17 ENCOUNTER — Ambulatory Visit: Payer: Self-pay | Admitting: Allergy

## 2016-05-13 ENCOUNTER — Encounter (HOSPITAL_COMMUNITY): Payer: Self-pay | Admitting: Family Medicine

## 2016-05-13 ENCOUNTER — Ambulatory Visit (HOSPITAL_COMMUNITY)
Admission: EM | Admit: 2016-05-13 | Discharge: 2016-05-13 | Disposition: A | Discharge status: Home / Self Care | Payer: PRIVATE HEALTH INSURANCE | Attending: Family Medicine | Admitting: Family Medicine

## 2016-05-13 ENCOUNTER — Ambulatory Visit (INDEPENDENT_AMBULATORY_CARE_PROVIDER_SITE_OTHER): Payer: PRIVATE HEALTH INSURANCE

## 2016-05-13 DIAGNOSIS — R52 Pain, unspecified: Secondary | ICD-10-CM | POA: Diagnosis not present

## 2016-05-13 DIAGNOSIS — M25551 Pain in right hip: Secondary | ICD-10-CM

## 2016-05-13 LAB — POCT PREGNANCY, URINE: PREG TEST UR: NEGATIVE

## 2016-05-13 MED ORDER — PREDNISONE 20 MG PO TABS: ORAL_TABLET | ORAL | 0 refills | Status: DC

## 2016-05-13 MED ORDER — HYDROCODONE-ACETAMINOPHEN 5-325 MG PO TABS: 1.0000 | ORAL_TABLET | Freq: Four times a day (QID) | ORAL | 0 refills | Status: DC | PRN

## 2016-05-13 NOTE — ED Triage Notes (Signed)
The patient presented to the [facility] with a complaint of right hip pain that started 5 days ago. The patient stated that she was evaluated 3 days ago and told she had arthritis.

## 2016-05-13 NOTE — ED Provider Notes (Signed)
[facility]    CSN: [id number] Arrival date & time: 05/13/16  4098     History   Chief Complaint Chief Complaint  Patient presents with  . Hip Pain    HPI Regina Griffith is a 26 y.o. female.   The patient presented to the [facility] with a complaint of right hip pain that started 5 days ago. The patient stated that she was evaluated 3 days ago and told she had arthritis.   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 She first noticed pain when she was disembarking from the flight and walking down the [place]. She thought it would go away by itself but when she woke up the next morning the pain was there. She went to see her doctor in [place] who did not examine her but just talk to her and told her it was arthritis. No medicines were provided. She's continue to have sharp stabbing pain when she moves in different ways in the right hip.  Patient has no other joint pain. She's had no trauma that she can think of.  Patient is a former triple jump athlete. Last menstrual period was 7 days ago      Past Medical History:  Diagnosis Date  . Asthma   . Headache   . Pregnant   . Urticaria     Patient Active Problem List   Diagnosis Date Noted  . Chronic headache disorder 01/09/2016  . Migraine 01/09/2016  . Chronic rhinitis 11/19/2015  . Mild intermittent asthma 11/19/2015  . Adverse drug reaction 11/19/2015  . History of spontaneous abortion 08/22/2015    History reviewed. No pertinent surgical history.  OB History    Gravida Para Term Preterm AB Living   1             SAB TAB Ectopic Multiple Live Births                   Home Medications    Prior to Admission medications   Medication Sig Start Date End Date Taking? Authorizing Provider  levocetirizine (XYZAL) 5 MG tablet  11/19/15  Yes Historical Provider, MD  Norethin Ace-Eth Estrad-FE (TAYTULLA) 1-20 MG-MCG(24) CAPS Take 1 tablet by mouth.   Yes Historical Provider, MD  ondansetron  (ZOFRAN) 8 MG tablet  12/21/15   Historical Provider, MD    Family History Family History  Problem Relation Age of Onset  . Migraines Mother     Social History Social History  Substance Use Topics  . Smoking status: Never Smoker  . Smokeless tobacco: Never Used  . Alcohol use No     Allergies   Azithromycin; Flagyl [metronidazole]; Coconut flavor; Doxycycline; Fluconazole; Clindamycin/lincomycin; Latex; and Penicillins   Review of Systems Review of Systems  Musculoskeletal: Positive for gait problem.  All other systems reviewed and are negative.    Physical Exam Triage Vital Signs ED Triage Vitals [05/13/16 1022]  Enc Vitals Group     BP 116/73     Pulse Rate 81     Resp 16     Temp 98.5 F (36.9 C)     Temp Source Oral     SpO2 100 %     Weight      Height      Head Circumference      Peak Flow      Pain Score 8     Pain Loc      Pain Edu?      Excl. in  GC?    No data found.   Updated Vital Signs BP 116/73 (BP Location: Left Arm)   Pulse 81   Temp 98.5 F (36.9 C) (Oral)   Resp 16   LMP 12/26/2015 (Approximate)   SpO2 100%    Physical Exam  Constitutional: She is oriented to person, place, and time. She appears well-developed and well-nourished.  HENT:  Head: Normocephalic.  Right Ear: External ear normal.  Left Ear: External ear normal.  Mouth/Throat: Oropharynx is clear and moist.  Eyes: Conjunctivae and EOM are normal. Pupils are equal, round, and reactive to light.  Neck: Normal range of motion. Neck supple.  Pulmonary/Chest: Effort normal.  Musculoskeletal: She exhibits no edema, tenderness or deformity.  Patient has sharp pain with internal/external rotation of the right hip. She has full straight leg raising and is able to bring her knee to her chest.    there is no tenderness around the hip area  Ankle and knee range of motion on the right side are normal.  Neurological: She is alert and oriented to person, place, and time.    Skin: Skin is warm and dry.  Nursing note and vitals reviewed.    UC Treatments / Results  Labs (all labs ordered are listed, but only abnormal results are displayed) Labs Reviewed - No data to display  EKG  EKG Interpretation None       Radiology No results found.  Procedures Procedures (including critical care time)  Medications Ordered in UC Medications - No data to display   Initial Impression / Assessment and Plan / UC Course  I have reviewed the triage vital signs and the nursing notes.  Pertinent labs & imaging results that were available during my care of the patient were reviewed by me and considered in my medical decision making (see chart for details).     Final Clinical Impressions(s) / UC Diagnoses   Final diagnoses:  None    New Prescriptions New Prescriptions   No medications on file     Elvina Sidle, MD 05/13/16 1119

## 2016-05-13 NOTE — Discharge Instructions (Addendum)
This appears to be a muscle strain in the muscles that elevate the leg and rotated. The pain should be gone over the next several days with the medicine prescribed. If you're not getting better, please follow-up either here or with your orthopedic specialist.

## 2016-07-13 ENCOUNTER — Ambulatory Visit (HOSPITAL_COMMUNITY)
Admission: EM | Admit: 2016-07-13 | Discharge: 2016-07-13 | Disposition: A | Discharge status: Home / Self Care | Payer: PRIVATE HEALTH INSURANCE | Attending: Internal Medicine | Admitting: Internal Medicine

## 2016-07-13 ENCOUNTER — Encounter (HOSPITAL_COMMUNITY): Payer: Self-pay | Admitting: Emergency Medicine

## 2016-07-13 DIAGNOSIS — Z881 Allergy status to other antibiotic agents status: Secondary | ICD-10-CM | POA: Insufficient documentation

## 2016-07-13 DIAGNOSIS — Z888 Allergy status to other drugs, medicaments and biological substances status: Secondary | ICD-10-CM | POA: Insufficient documentation

## 2016-07-13 DIAGNOSIS — Z91040 Latex allergy status: Secondary | ICD-10-CM | POA: Insufficient documentation

## 2016-07-13 DIAGNOSIS — R197 Diarrhea, unspecified: Secondary | ICD-10-CM | POA: Insufficient documentation

## 2016-07-13 DIAGNOSIS — R109 Unspecified abdominal pain: Secondary | ICD-10-CM | POA: Diagnosis present

## 2016-07-13 DIAGNOSIS — Z880 Allergy status to penicillin: Secondary | ICD-10-CM | POA: Insufficient documentation

## 2016-07-13 DIAGNOSIS — A09 Infectious gastroenteritis and colitis, unspecified: Secondary | ICD-10-CM

## 2016-07-13 MED ORDER — ONDANSETRON HCL 4 MG PO TABS: 8.0000 mg | ORAL_TABLET | ORAL | 0 refills | Status: DC | PRN

## 2016-07-13 NOTE — Discharge Instructions (Addendum)
Bring stool sample back for testing.  The urgent care will contact you if further treatment is needed.  Prescription for ondansetron (zofran) for nausea was sent to the pharmacy.  Push fluids, rest.  Note for work today/tomorrow.  Anticipate gradual improvement in stool frequency/consistency over the next few weeks as gut lining heals.

## 2016-07-13 NOTE — ED Provider Notes (Signed)
[facility]    CSN: [id number] Arrival date & time: 07/13/16  1257     History   Chief Complaint Chief Complaint  Patient presents with  . Abdominal Cramping    HPI Regina Griffith is a 26 y.o. female. She presents today with 4 day history of abdominal cramping and diarrhea, started during a trip to [place]. No fever, not vomiting. Has been able to push fluids orally. Stools are watery/loose, mucousy. They occur right after eating. She woke up 3 or 4 times in the night last night to have a bowel movement. She does not think there is blood in the stool. She is having a period right now.    HPI  Past Medical History:  Diagnosis Date  . Asthma   . Headache   . Pregnant   . Urticaria     Patient Active Problem List   Diagnosis Date Noted  . Chronic headache disorder 01/09/2016  . Migraine 01/09/2016  . Chronic rhinitis 11/19/2015  . Mild intermittent asthma 11/19/2015  . Adverse drug reaction 11/19/2015  . History of spontaneous abortion 08/22/2015    History reviewed. No pertinent surgical history.  OB History    Gravida Para Term Preterm AB Living   1             SAB TAB Ectopic Multiple Live Births                   Home Medications    Prior to Admission medications   Medication Sig Start Date End Date Taking? Authorizing Provider  ondansetron (ZOFRAN) 4 MG tablet Take 2 tablets (8 mg total) by mouth every 4 (four) hours as needed for nausea or vomiting. 07/13/16   Eustace MooreMurray, Jamarcus Laduke W, MD    Family History Family History  Problem Relation Age of Onset  . Migraines Mother     Social History Social History  Substance Use Topics  . Smoking status: Never Smoker  . Smokeless tobacco: Never Used  . Alcohol use No     Allergies   Azithromycin; Flagyl [metronidazole]; Coconut flavor; Doxycycline; Fluconazole; Clindamycin/lincomycin; Latex; and Penicillins   Review of Systems Review of Systems  All other systems reviewed and are  negative.    Physical Exam Triage Vital Signs ED Triage Vitals  Enc Vitals Group     BP 07/13/16 1351 114/60     Pulse Rate 07/13/16 1351 77     Resp 07/13/16 1351 16     Temp 07/13/16 1351 98.1 F (36.7 C)     Temp Source 07/13/16 1351 Oral     SpO2 07/13/16 1351 100 %     Weight --      Height --      Pain Score 07/13/16 1349 7     Pain Loc --    Updated Vital Signs BP 114/60 (BP Location: Right Arm)   Pulse 77   Temp 98.1 F (36.7 C) (Oral)   Resp 16   SpO2 100%   Physical Exam  Constitutional: She is oriented to person, place, and time. No distress.  HENT:  Head: Atraumatic.  Eyes:  Conjugate gaze observed, no eye redness/discharge  Neck: Neck supple.  Cardiovascular: Normal rate and regular rhythm.   Pulmonary/Chest: No respiratory distress. She has no wheezes. She has no rales.  Lungs clear, symmetric breath sounds  Abdominal: Soft. She exhibits no distension. There is no tenderness. There is no rebound and no guarding.  Diffuse mild tenderness to palpation, nonfocal  Musculoskeletal: Normal range of motion.  Neurological: She is alert and oriented to person, place, and time.  Skin: Skin is warm and dry.  Nursing note and vitals reviewed.    UC Treatments / Results  Labs (all labs ordered are listed, but only abnormal results are displayed) Labs Reviewed  GASTROINTESTINAL PANEL BY PCR, STOOL (REPLACES STOOL CULTURE) - Abnormal; Notable for the following:       Result Value   Enteroaggregative E coli (EAEC) DETECTED (*)    Enteropathogenic E coli (EPEC) DETECTED (*)    All other components within normal limits    Procedures Procedures (including critical care time) None today  Final Clinical Impressions(s) / UC Diagnoses   Final diagnoses:  Traveler's diarrhea   Bring stool sample back for testing.  The urgent care will contact you if further treatment is needed.  Prescription for ondansetron (zofran) for nausea was sent to the pharmacy.  Push  fluids, rest.  Note for work today/tomorrow.  Anticipate gradual improvement in stool frequency/consistency over the next few weeks as gut lining heals.    New Prescriptions New Prescriptions   ONDANSETRON (ZOFRAN) 4 MG TABLET    Take 2 tablets (8 mg total) by mouth every 4 (four) hours as needed for nausea or vomiting.     Eustace Moore, MD 07/15/16 1247

## 2016-07-13 NOTE — ED Notes (Signed)
Stool kit given

## 2016-07-13 NOTE — ED Triage Notes (Signed)
The patient presented to the [facility] with a complaint of abdominal pain with N/D since returning from [place] 3 days ago.

## 2016-07-14 ENCOUNTER — Telehealth (HOSPITAL_COMMUNITY): Payer: Self-pay | Admitting: Emergency Medicine

## 2016-07-14 ENCOUNTER — Telehealth (HOSPITAL_COMMUNITY): Payer: Self-pay | Admitting: *Deleted

## 2016-07-14 LAB — GASTROINTESTINAL PANEL BY PCR, STOOL (REPLACES STOOL CULTURE)
ASTROVIRUS: NOT DETECTED
Adenovirus F40/41: NOT DETECTED
CAMPYLOBACTER SPECIES: NOT DETECTED
Cryptosporidium: NOT DETECTED
Cyclospora cayetanensis: NOT DETECTED
ENTEROPATHOGENIC E COLI (EPEC): DETECTED — AB
ENTEROTOXIGENIC E COLI (ETEC): NOT DETECTED
Entamoeba histolytica: NOT DETECTED
Enteroaggregative E coli (EAEC): DETECTED — AB
Giardia lamblia: NOT DETECTED
Norovirus GI/GII: NOT DETECTED
PLESIMONAS SHIGELLOIDES: NOT DETECTED
ROTAVIRUS A: NOT DETECTED
Salmonella species: NOT DETECTED
Sapovirus (I, II, IV, and V): NOT DETECTED
Shiga like toxin producing E coli (STEC): NOT DETECTED
Shigella/Enteroinvasive E coli (EIEC): NOT DETECTED
VIBRIO SPECIES: NOT DETECTED
Vibrio cholerae: NOT DETECTED
Yersinia enterocolitica: NOT DETECTED

## 2016-07-14 MED ORDER — CIPROFLOXACIN HCL 750 MG PO TABS: 750.0000 mg | ORAL_TABLET | Freq: Every day | ORAL | 0 refills | Status: AC

## 2016-07-14 NOTE — ED Notes (Signed)
Lab  Called  With  Results  Of  gipci   Entero aggretaved  E  Coli   Grown      enteropatho genic   E  Coli       Grown

## 2016-07-15 ENCOUNTER — Telehealth (HOSPITAL_COMMUNITY): Payer: Self-pay | Admitting: Emergency Medicine

## 2016-07-15 NOTE — Telephone Encounter (Signed)
Patient returned phone call from last night. Patient was advised of PCR results. Patient stated that she is still symptomatic with diarrhea. The patient confirmed not being pregnant or nursing. Patient was advised to start Cipro that was sent to her pharmacy.

## 2016-11-10 ENCOUNTER — Encounter (HOSPITAL_COMMUNITY): Payer: Self-pay

## 2016-11-10 ENCOUNTER — Emergency Department (HOSPITAL_COMMUNITY)
Admission: EM | Admit: 2016-11-10 | Discharge: 2016-11-10 | Disposition: A | Discharge status: Home / Self Care | Payer: PRIVATE HEALTH INSURANCE | Attending: Emergency Medicine | Admitting: Emergency Medicine

## 2016-11-10 ENCOUNTER — Emergency Department (HOSPITAL_COMMUNITY): Payer: PRIVATE HEALTH INSURANCE

## 2016-11-10 DIAGNOSIS — M25551 Pain in right hip: Secondary | ICD-10-CM | POA: Insufficient documentation

## 2016-11-10 DIAGNOSIS — Z91040 Latex allergy status: Secondary | ICD-10-CM | POA: Insufficient documentation

## 2016-11-10 DIAGNOSIS — J45909 Unspecified asthma, uncomplicated: Secondary | ICD-10-CM | POA: Insufficient documentation

## 2016-11-10 MED ORDER — DEXAMETHASONE SODIUM PHOSPHATE 10 MG/ML IJ SOLN
10.0000 mg | Freq: Once | INTRAMUSCULAR | Status: AC
Start: 2016-11-10 — End: 2016-11-10
  Administered 2016-11-10: 10 mg via INTRAMUSCULAR
  Filled 2016-11-10: qty 1

## 2016-11-10 NOTE — ED Provider Notes (Signed)
[facility]   CSN: [id number] Arrival date & time: 11/10/16  1813     History   Chief Complaint Chief Complaint  Patient presents with  . Hip Pain    HPI Regina Griffith is a 26 y.o. female.  HPI   Regina Griffith is a 26 y.o. female, with a history of Asthma, presenting to the ED with right hip pain since April 2018. 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 Xrays at [facility] after the incident showed no abnormality.  Has had pain intermittently since that time, but recently more pain. Pain is 9/10, aching, nonradiating, however, now has pain in the posterior hip radiating down the back of the right upper leg to the level of the knee. Pain improves with ambulation. Denies numbness, weakness, groin pain, fever/chills, additional injury, or any other complaints.  Past Medical History:  Diagnosis Date  . Asthma   . Headache   . Pregnant   . Urticaria     Patient Active Problem List   Diagnosis Date Noted  . Chronic headache disorder 01/09/2016  . Migraine 01/09/2016  . Chronic rhinitis 11/19/2015  . Mild intermittent asthma 11/19/2015  . Adverse drug reaction 11/19/2015  . History of spontaneous abortion 08/22/2015    History reviewed. No pertinent surgical history.  OB History    Gravida Para Term Preterm AB Living   1             SAB TAB Ectopic Multiple Live Births                   Home Medications    Prior to Admission medications   Medication Sig Start Date End Date Taking? Authorizing Provider  ondansetron (ZOFRAN) 4 MG tablet Take 2 tablets (8 mg total) by mouth every 4 (four) hours as needed for nausea or vomiting. 07/13/16   Eustace Moore, MD    Family History Family History  Problem Relation Age of Onset  . Migraines Mother     Social History Social History  Substance Use Topics  . Smoking status: Never Smoker  . Smokeless  tobacco: Never Used  . Alcohol use No     Allergies   Azithromycin; Flagyl [metronidazole]; Coconut flavor; Doxycycline; Fluconazole; Clindamycin/lincomycin; Latex; and Penicillins   Review of Systems Review of Systems  Musculoskeletal: Positive for arthralgias.  Neurological: Negative for weakness and numbness.     Physical Exam Updated Vital Signs BP 119/77 (BP Location: Right Arm)   Pulse 96   Temp 98.1 F (36.7 C) (Oral)   Resp 18   LMP 10/13/2016 (Within Weeks)   SpO2 100%   Physical Exam  Constitutional: She appears well-developed and well-nourished. No distress.  HENT:  Head: Normocephalic and atraumatic.  Eyes: Conjunctivae are normal.  Neck: Neck supple.  Cardiovascular: Normal rate, regular rhythm and intact distal pulses.   Pulmonary/Chest: Effort normal.  Musculoskeletal:  Tenderness to the right anterior hip. Full passive range of motion, but pain elicited especially with internal rotation and extension of the right hip. Active range of motion limited by pain. Range motion without pain in the right knee and ankle. Full range of motion in the spine without pain.  Neurological: She is alert.  5/5 strength with flexion and extension at the right hip, knee, and ankle. No noted sensory deficits. Ambulatory without assistance.  Skin: Skin is warm and dry. Capillary  refill takes less than 2 seconds. She is not diaphoretic. No pallor.  Psychiatric: She has a normal mood and affect. Her behavior is normal.  Nursing note and vitals reviewed.    ED Treatments / Results  Labs (all labs ordered are listed, but only abnormal results are displayed) Labs Reviewed - No data to display  EKG  EKG Interpretation None       Radiology Dg Hip Unilat W Or Wo Pelvis 2-3 Views Right  Result Date: 11/10/2016 CLINICAL DATA:  Nontraumatic anterior right hip pain, greatest with internal rotation. Recent steroid injection. EXAM: DG HIP (WITH OR WITHOUT PELVIS) 2-3V RIGHT  COMPARISON:  05/13/2016 FINDINGS: There is no evidence of hip fracture or dislocation. Os acetabuli noted. No arthritic changes. No bone lesion or bony destruction. No interval change. IMPRESSION: Negative. Electronically Signed   By: Ellery Plunk M.D.   On: 11/10/2016 21:21    Procedures Procedures (including critical care time)  Medications Ordered in ED Medications  dexamethasone (DECADRON) injection 10 mg (10 mg Intramuscular Given 11/10/16 2054)     Initial Impression / Assessment and Plan / ED Course  I have reviewed the triage vital signs and the nursing notes.  Pertinent labs & imaging results that were available during my care of the patient were reviewed by me and considered in my medical decision making (see chart for details).     Patient presents with acute on chronic right hip pain. Suspicion for septic joint is low. No noted neurologic or functional deficits. No acute abnormalities on x-ray. Orthopedic follow-up. The patient was given instructions for home care as well as return precautions. Patient voices understanding of these instructions, accepts the plan, and is comfortable with discharge.    Final Clinical Impressions(s) / ED Diagnoses   Final diagnoses:  Right hip pain    New Prescriptions New Prescriptions   No medications on file     Concepcion Living 11/10/16 2144    Benjiman Core, MD 11/10/16 2334

## 2016-11-10 NOTE — Discharge Instructions (Signed)
There were no acute abnormalities on the x-ray.  Antiinflammatory medications: Take 600 mg of ibuprofen every 6 hours or 440 mg (over the counter dose) to 500 mg (prescription dose) of naproxen every 12 hours for the next 3 days. After this time, these medications may be used as needed for pain. Take these medications with food to avoid upset stomach. Choose only one of these medications, do not take them together.  Tylenol: Should you continue to have additional pain while taking the ibuprofen or naproxen, you may add in tylenol as needed. Your daily total maximum amount of tylenol from all sources should be limited to /day for persons without liver problems, or /day for those with liver problems. Muscle relaxer: Robaxin is a muscle relaxer and may help loosen stiff muscles. Do not take the Robaxin while driving or performing other dangerous activities.  Lidocaine patches: These are available via either prescription or over-the-counter. The over-the-counter option may be more economical one and are likely just as effective. There are multiple over-the-counter brands, such as Salonpas. Exercises: Be sure to perform the attached exercises starting with three times a week and working up to performing them daily. This is an essential part of preventing long term problems.   Follow up with a primary care provider or orthopedic specialist for any future management of these complaints.

## 2016-11-10 NOTE — ED Triage Notes (Signed)
Pt reports she has had right hip pain for several months. She reports the pain improved but has come back this morning. Pt ambulatory. Reports pain from lower back down to her knee.

## 2016-11-10 NOTE — ED Notes (Signed)
See EDP secondary assessment.  

## 2017-06-21 ENCOUNTER — Encounter: Payer: Self-pay | Admitting: Internal Medicine

## 2017-06-23 ENCOUNTER — Ambulatory Visit: Payer: PRIVATE HEALTH INSURANCE | Admitting: Allergy

## 2017-06-24 ENCOUNTER — Encounter: Payer: Self-pay | Admitting: Allergy

## 2017-06-24 ENCOUNTER — Ambulatory Visit: Payer: PRIVATE HEALTH INSURANCE | Admitting: Allergy

## 2017-06-24 VITALS — BP 114/82 | HR 76 | Resp 16 | Ht 67.25 in | Wt 150.8 lb

## 2017-06-24 DIAGNOSIS — T7840XD Allergy, unspecified, subsequent encounter: Secondary | ICD-10-CM | POA: Diagnosis not present

## 2017-06-24 DIAGNOSIS — J4520 Mild intermittent asthma, uncomplicated: Secondary | ICD-10-CM

## 2017-06-24 DIAGNOSIS — J310 Chronic rhinitis: Secondary | ICD-10-CM

## 2017-06-24 MED ORDER — IPRATROPIUM BROMIDE 0.06 % NA SOLN: 2.0000 | Freq: Four times a day (QID) | NASAL | 5 refills | Status: DC | PRN

## 2017-06-24 MED ORDER — EPINEPHRINE 0.3 MG/0.3ML IJ SOAJ: INTRAMUSCULAR | 2 refills | Status: AC

## 2017-06-24 NOTE — Patient Instructions (Addendum)
Allergic reactions  - will obtain labs to see if there are any underlying causes/triggers to your reactions as you have had some reactions (swelling) not associated with any medication reactions. - have access to epinephrine device in case of allergic reaction and follow emergency action plan.     - continue avoidance of medications you have had reactions to including penicillins, metronidazole, azithromycin, doxycyline and clindamycin.    Recommend performing penicillin skin testing when able to be off antihistamines for 3 days.    Start taking Xyzal  and Pepcid  twice a day to help with swelling episodes. Take until you are swelling free for more than 2-3 weeks  Chronic rhinitis  - will obtain environmental allergen panel  - use nasal atrovent to use if you are having runny nose/nasal drip.  Use 2 sprays each nostril up to 3-4 times a day as needed   - Nasal saline lavage (NeilMed) as needed has been recommended  - Take levocetirizine, 5 mg, may decrease to 1 tab daily once swelling has improved.   Mild intermittent asthma - Continue albuterol HFA, 1-2 inhalations every 4-6 hours as needed. Asthma control goals:   Full participation in all desired activities (may need albuterol before activity)  Albuterol use two time or less a week on average (not counting use with activity)  Cough interfering with sleep two time or less a month  Oral steroids no more than once a year  No hospitalizations   Follow-up 6 months or sooner for penicillin skin testing

## 2017-06-24 NOTE — Progress Notes (Signed)
Follow-up Note  RE: Regina Griffith MRN: [id number] DOB: 01-15-1991 Date of Office Visit: 06/24/2017   History of present illness: Regina Griffith is a 27 y.o. female presenting today for follow-up of of allergic reaction/drug reaction, chronic rhinitis and asthma.  She was last seen in the office on 11/21/15 by myself.  She states she was doing relatively well until about April 2019 when she developed an infection including blisters on her lips.  She states she was seeing a new PCP who did not have her drug allergy profile and per pt did not listen that she was allergic to doxycycline.  She states since she was prescribed doxycycline she took the medication and on the 2nd dose she developed vomiting, diarrhea, lip swelling, tongue swelling about 30 minutes after the dose.  She took benadryl and states symptoms improved.  She went back to PCP and states she received a steroid shot and antibiotic changed to levofloxacin which she tolerated.     This morning she states she woke up with lip and tongue swelling which has improved without intervention by appointment today.  She states she had another episode of lip swelling when she was eating a ice cream waffle cone. She states she eats dairy products without issue.     She has reactions to variety of different medications including flagyl, clindamycin and penicillins.  She states her reaction to penicillins was at least a decade ago and she recalls having a rash.   She does states since her last visit she was prescribe tinidazole and she states she did not have a reaction to it like she has with flagyl.  She states she did have some body aches with tinidazole and that was all she reports.     With her rhinitis sneezing, runny nose with pollen season.  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  She used dymista after last visit and reports did not help much with nasal symptom  control.  She is not taking any antihistamines at this time.     With her asthma she reports has been well controlled.  She has albuterol which she uses rather infequently and denies any oral steroid needs for asthma flare.     Review of systems: Review of Systems  Constitutional: Negative for chills, fever and malaise/fatigue.  HENT: Positive for congestion. Negative for ear discharge, ear pain, nosebleeds, sinus pain and sore throat.   Eyes: Negative for pain, discharge and redness.  Respiratory: Negative for cough, shortness of breath and wheezing.   Cardiovascular: Negative for chest pain.  Gastrointestinal: Negative for abdominal pain, constipation, diarrhea, heartburn, nausea and vomiting.  Musculoskeletal: Negative for joint pain.  Skin: Negative for itching and rash.  Neurological: Negative for headaches.    All other systems negative unless noted above in HPI  Past medical/social/surgical/family history have been reviewed and are unchanged unless specifically indicated below.  No changes  Medication List: Allergies as of 06/24/2017      Reactions   Azithromycin Anaphylaxis, Swelling   Flagyl [metronidazole] Anaphylaxis, Swelling   Coconut Flavor Itching, Swelling   tongues   Doxycycline Nausea And Vomiting, Other (See Comments)   Lips tingling    Fluconazole Swelling   Lips   Clindamycin/lincomycin Rash   Latex Itching, Rash   Penicillins Rash   Has patient had a PCN reaction causing immediate rash, facial/tongue/throat swelling, SOB or lightheadedness with hypotension: Yes Has patient had  a PCN reaction causing severe rash involving mucus membranes or skin necrosis: No Has patient had a PCN reaction that required hospitalization No Has patient had a PCN reaction occurring within the last 10 years: No If all of the above answers are "NO", then may proceed with Cephalosporin use.      Medication List        Accurate as of 06/24/17  6:24 PM. Always use your most  recent med list.          BENADRYL ALLERGY 25 mg capsule Generic drug:  diphenhydrAMINE Take by mouth.   DYMISTA 137-50 MCG/ACT Susp Generic drug:  Azelastine-Fluticasone Place into the nose.   ondansetron 4 MG tablet Commonly known as:  ZOFRAN Take 4 mg by mouth every 8 (eight) hours as needed for nausea or vomiting.   PROVENTIL HFA 108 (90 Base) MCG/ACT inhaler Generic drug:  albuterol Inhale into the lungs.   SUMAtriptan 25 MG tablet Commonly known as:  IMITREX Take by mouth.   TAYTULLA 1-20 MG-MCG(24) Caps Generic drug:  Norethin Ace-Eth Estrad-FE Take 1 tablet by mouth daily.   traZODone 50 MG tablet Commonly known as:  DESYREL Take 50 mg by mouth at bedtime.       Known medication allergies: Allergies  Allergen Reactions  . Azithromycin Anaphylaxis and Swelling  . Flagyl [Metronidazole] Anaphylaxis and Swelling  . Coconut Flavor Itching and Swelling    tongues  . Doxycycline Nausea And Vomiting and Other (See Comments)    Lips tingling   . Fluconazole Swelling    Lips   . Clindamycin/Lincomycin Rash  . Latex Itching and Rash  . Penicillins Rash    Has patient had a PCN reaction causing immediate rash, facial/tongue/throat swelling, SOB or lightheadedness with hypotension: Yes Has patient had a PCN reaction causing severe rash involving mucus membranes or skin necrosis: No Has patient had a PCN reaction that required hospitalization No Has patient had a PCN reaction occurring within the last 10 years: No If all of the above answers are "NO", then may proceed with Cephalosporin use.      Physical examination: Blood pressure 114/82, pulse 76, resp. rate 16, height 5' 7.25" (1.708 m), weight 150 lb 12.8 oz (68.4 kg), unknown if currently breastfeeding.  General: Alert, interactive, in no acute distress. HEENT: PERRLA, TMs pearly gray, turbinates minimally edematous without discharge, post-pharynx non erythematous. Neck: Supple without  lymphadenopathy. Lungs: Clear to auscultation without wheezing, rhonchi or rales. {no increased work of breathing. CV: Normal S1, S2 without murmurs. Abdomen: Nondistended, nontender. Skin: Warm and dry, without lesions or rashes. Extremities:  No clubbing, cyanosis or edema. Neuro:   Grossly intact.  Diagnositics/Labs:  Spirometry: FEV1: 3.35L  109%, FVC: 3.79L 106%, ratio consistent with nonobstructive pattern  Assessment and plan:   Allergic reactions  - she has had reactions related to drug reactions as well has unprovoked isolated episodes of lip and tongue swelling   - will obtain labs to see if there are any underlying causes/triggers to your reactions as you have had some reactions (swelling) not associated with any medication reactions.   - have access to epinephrine device Audry Riles) in case of allergic reaction and follow emergency action plan.     - continue avoidance of medications you have had reactions to including penicillins, metronidazole, azithromycin, doxycyline and clindamycin.    Recommend performing penicillin skin testing when able to be off antihistamines for 3 days.    Start taking Xyzal  and Pepcid  twice a  day to help with swelling episodes. Take until you are swelling free for more than 2-3 weeks  Chronic rhinitis  - will obtain environmental allergen panel  - use nasal atrovent to use if you are having runny nose/nasal drip.  Use 2 sprays each nostril up to 3-4 times a day as needed   - Nasal saline lavage (NeilMed) as needed has been recommended  - Take levocetirizine, 5 mg, may decrease to 1 tab daily once swelling has improved.   Mild intermittent asthma - Continue albuterol HFA, 1-2 inhalations every 4-6 hours as needed. Asthma control goals:   Full participation in all desired activities (may need albuterol before activity)  Albuterol use two time or less a week on average (not counting use with activity)  Cough interfering with sleep two  time or less a month  Oral steroids no more than once a year  No hospitalizations  FMLA forms completed today and faxed.   Follow-up 6 months or sooner for penicillin skin testing  I appreciate the opportunity to take part in Cierra's care. Please do not hesitate to contact me with questions.  Sincerely,   Margo Aye, MD Allergy/Immunology [facility]

## 2017-06-30 LAB — TRYPTASE: TRYPTASE: 1.7 ug/L — AB (ref 2.2–13.2)

## 2017-06-30 LAB — ALLERGENS, ZONE 2
Alternaria Alternata IgE: <0.1 kU/L
Amer Sycamore IgE Qn: <0.1 kU/L
Aspergillus Fumigatus IgE: <0.1 kU/L
Cat Dander IgE: <0.1 kU/L
Cedar, Mountain IgE: <0.1 kU/L
Cladosporium Herbarum IgE: <0.1 kU/L
Dog Dander IgE: <0.1 kU/L
Elm, American IgE: <0.1 kU/L
Hickory, White IgE: <0.1 kU/L
Mucor Racemosus IgE: <0.1 kU/L
Mugwort IgE Qn: <0.1 kU/L
Nettle IgE: <0.1 kU/L
Penicillium Chrysogen IgE: <0.1 kU/L
Pigweed, Rough IgE: <0.1 kU/L
Ragweed, Short IgE: <0.1 kU/L
Stemphylium Herbarum IgE: <0.1 kU/L
Timothy Grass IgE: <0.1 kU/L
White Mulberry IgE: <0.1 kU/L

## 2017-06-30 LAB — CBC WITH DIFFERENTIAL
BASOS ABS: 0.1 10*3/uL (ref 0.0–0.2)
BASOS: 1 %
EOS (ABSOLUTE): 0.2 10*3/uL (ref 0.0–0.4)
Eos: 2 %
Hematocrit: 38.9 % (ref 34.0–46.6)
Hemoglobin: 13.5 g/dL (ref 11.1–15.9)
IMMATURE GRANS (ABS): 0 10*3/uL (ref 0.0–0.1)
IMMATURE GRANULOCYTES: 0 %
LYMPHS: 21 %
Lymphocytes Absolute: 1.8 10*3/uL (ref 0.7–3.1)
MCH: 31.3 pg (ref 26.6–33.0)
MCHC: 34.7 g/dL (ref 31.5–35.7)
MCV: 90 fL (ref 79–97)
Monocytes Absolute: 0.8 10*3/uL (ref 0.1–0.9)
Monocytes: 9 %
NEUTROS PCT: 67 %
Neutrophils Absolute: 5.9 10*3/uL (ref 1.4–7.0)
RBC: 4.31 x10E6/uL (ref 3.77–5.28)
RDW: 13.7 % (ref 12.3–15.4)
WBC: 8.7 10*3/uL (ref 3.4–10.8)

## 2017-06-30 LAB — COMPREHENSIVE METABOLIC PANEL
A/G RATIO: 1.9 (ref 1.2–2.2)
ALK PHOS: 39 IU/L (ref 39–117)
ALT: 14 IU/L (ref 0–32)
AST: 21 IU/L (ref 0–40)
Albumin: 5 g/dL (ref 3.5–5.5)
BUN/Creatinine Ratio: 10 (ref 9–23)
BUN: 8 mg/dL (ref 6–20)
Bilirubin Total: 0.4 mg/dL (ref 0.0–1.2)
CALCIUM: 9.5 mg/dL (ref 8.7–10.2)
CO2: 22 mmol/L (ref 20–29)
Chloride: 103 mmol/L (ref 96–106)
Creatinine, Ser: 0.77 mg/dL (ref 0.57–1.00)
GFR calc Af Amer: 122 mL/min/{1.73_m2} (ref >59)
GFR calc non Af Amer: 106 mL/min/{1.73_m2} (ref >59)
GLOBULIN, TOTAL: 2.6 g/dL (ref 1.5–4.5)
GLUCOSE: 73 mg/dL (ref 65–99)
POTASSIUM: 4.1 mmol/L (ref 3.5–5.2)
SODIUM: 143 mmol/L (ref 134–144)
Total Protein: 7.6 g/dL (ref 6.0–8.5)

## 2017-06-30 LAB — ALPHA-GAL PANEL
BEEF CLASS INTERPRETATION: 0
Beef (Bos spp) IgE: <0.1 kU/L (ref <0.35)
Class Interpretation: 0
Lamb/Mutton (Ovis spp) IgE: <0.1 kU/L (ref <0.35)
PORK CLASS INTERPRETATION: 0

## 2017-06-30 LAB — HAE INTERPRETATION:

## 2017-06-30 LAB — HEREDITARY ANGIOEDEMA
C2 Esterase Inhibitor, Serum: 27 mg/dL (ref 21–39)
COMPLEMENT C4, SERUM: 18 mg/dL (ref 14–44)

## 2017-06-30 LAB — C1 ESTERASE INHIBITOR, FUNC: C1 Est.Inhib.Funct.: >92 %mean normal

## 2017-07-02 ENCOUNTER — Telehealth: Payer: Self-pay | Admitting: Allergy

## 2017-07-02 NOTE — Telephone Encounter (Signed)
Patient said she got her FMLA pw from [place], but it was was never faxed to the place where it needed to go. It was supposed to have been there by May 29. She is requesting it to be faxed and would like an email or phone confirmation that it was done. She goes to work at 12 and would like to know before then.

## 2017-07-02 NOTE — Telephone Encounter (Signed)
FMLA papers faxed and patient contacted.

## 2017-07-09 NOTE — Progress Notes (Signed)
Attempted to call patient. No answer, no voicemail.

## 2017-08-17 ENCOUNTER — Ambulatory Visit: Payer: PRIVATE HEALTH INSURANCE | Admitting: Internal Medicine

## 2017-08-17 ENCOUNTER — Encounter

## 2018-01-19 IMAGING — DX DG HIP (WITH OR WITHOUT PELVIS) 2-3V*R*
2 series · 2 of 2 positions shown · non-contrast
Comparison: None.

CLINICAL DATA: Right hip pain for 3 days.  No known injury.

EXAM:
DG HIP (WITH OR WITHOUT PELVIS) 2-3V RIGHT

[hip ap]
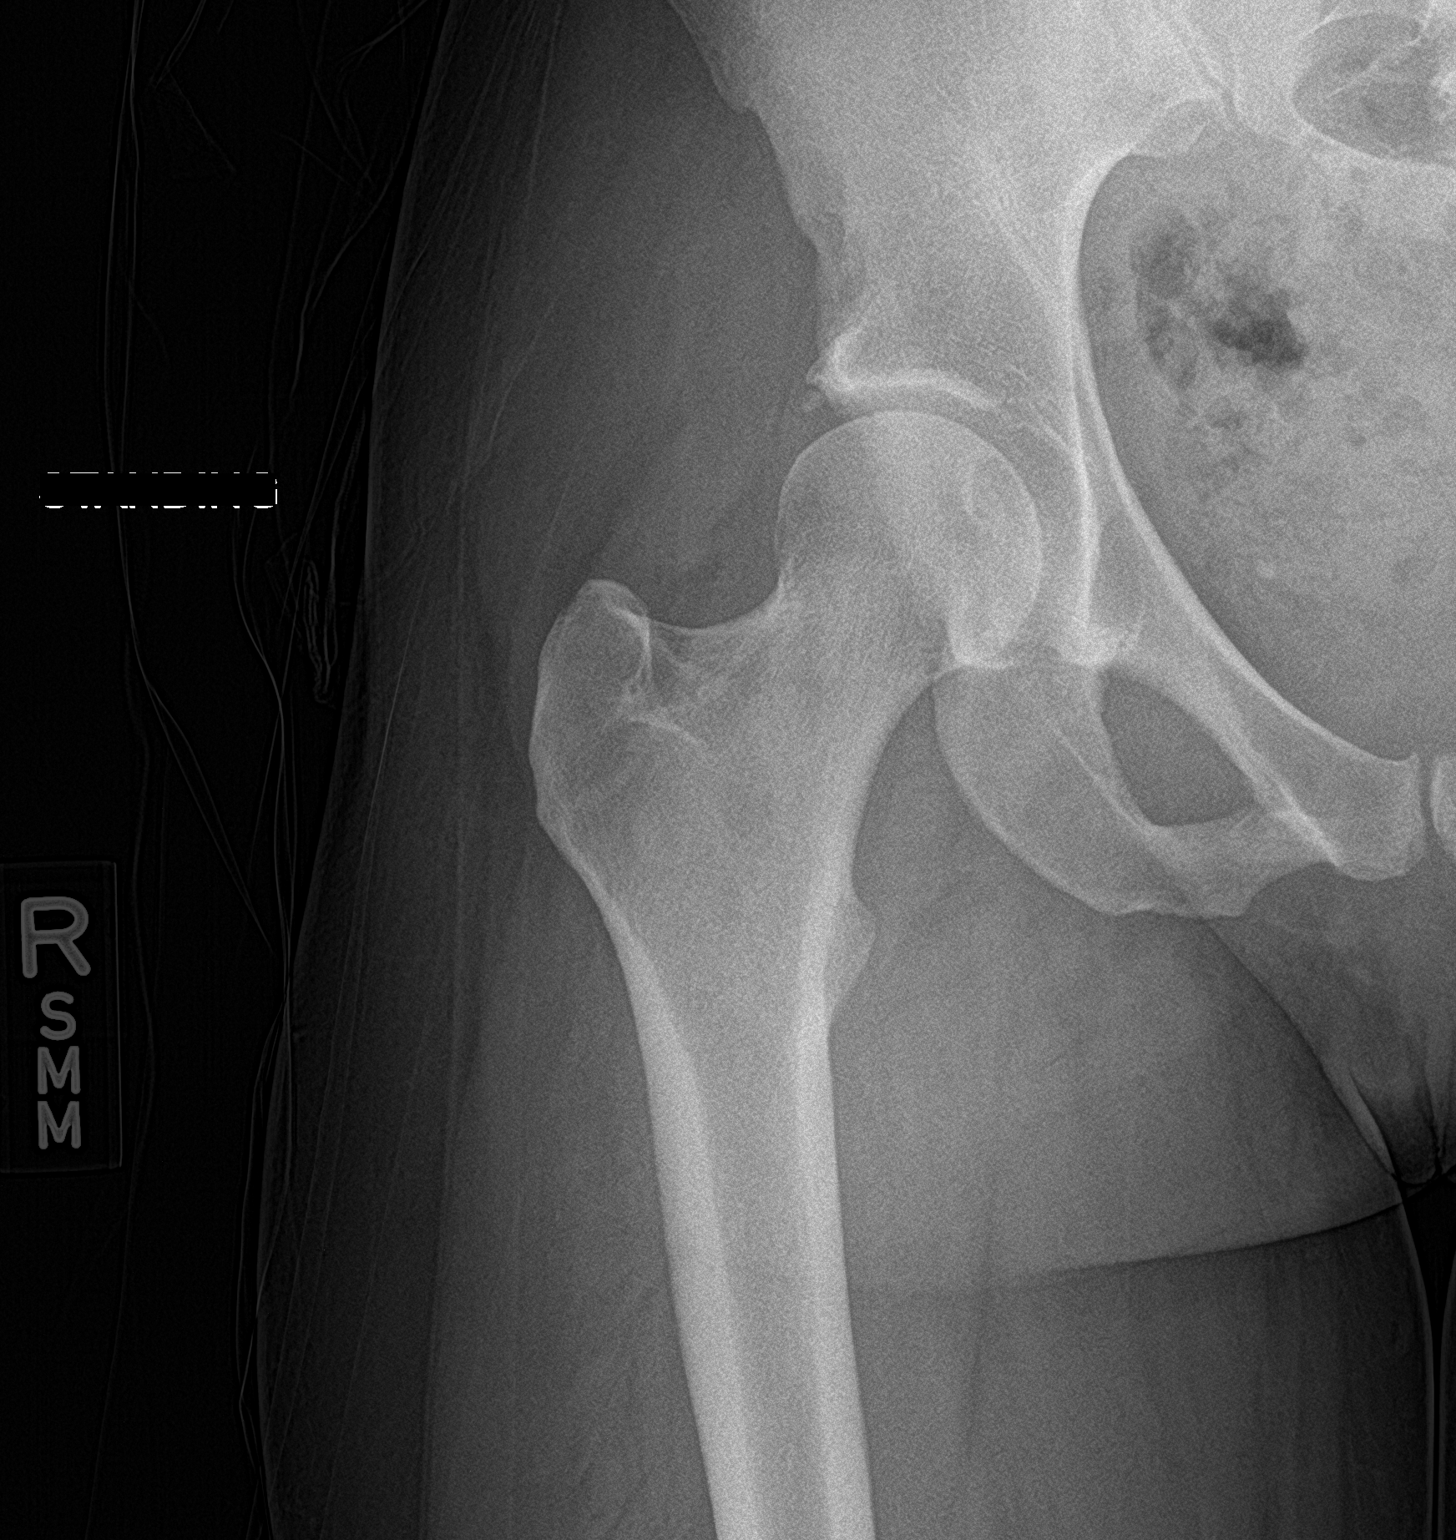

[hip lat]
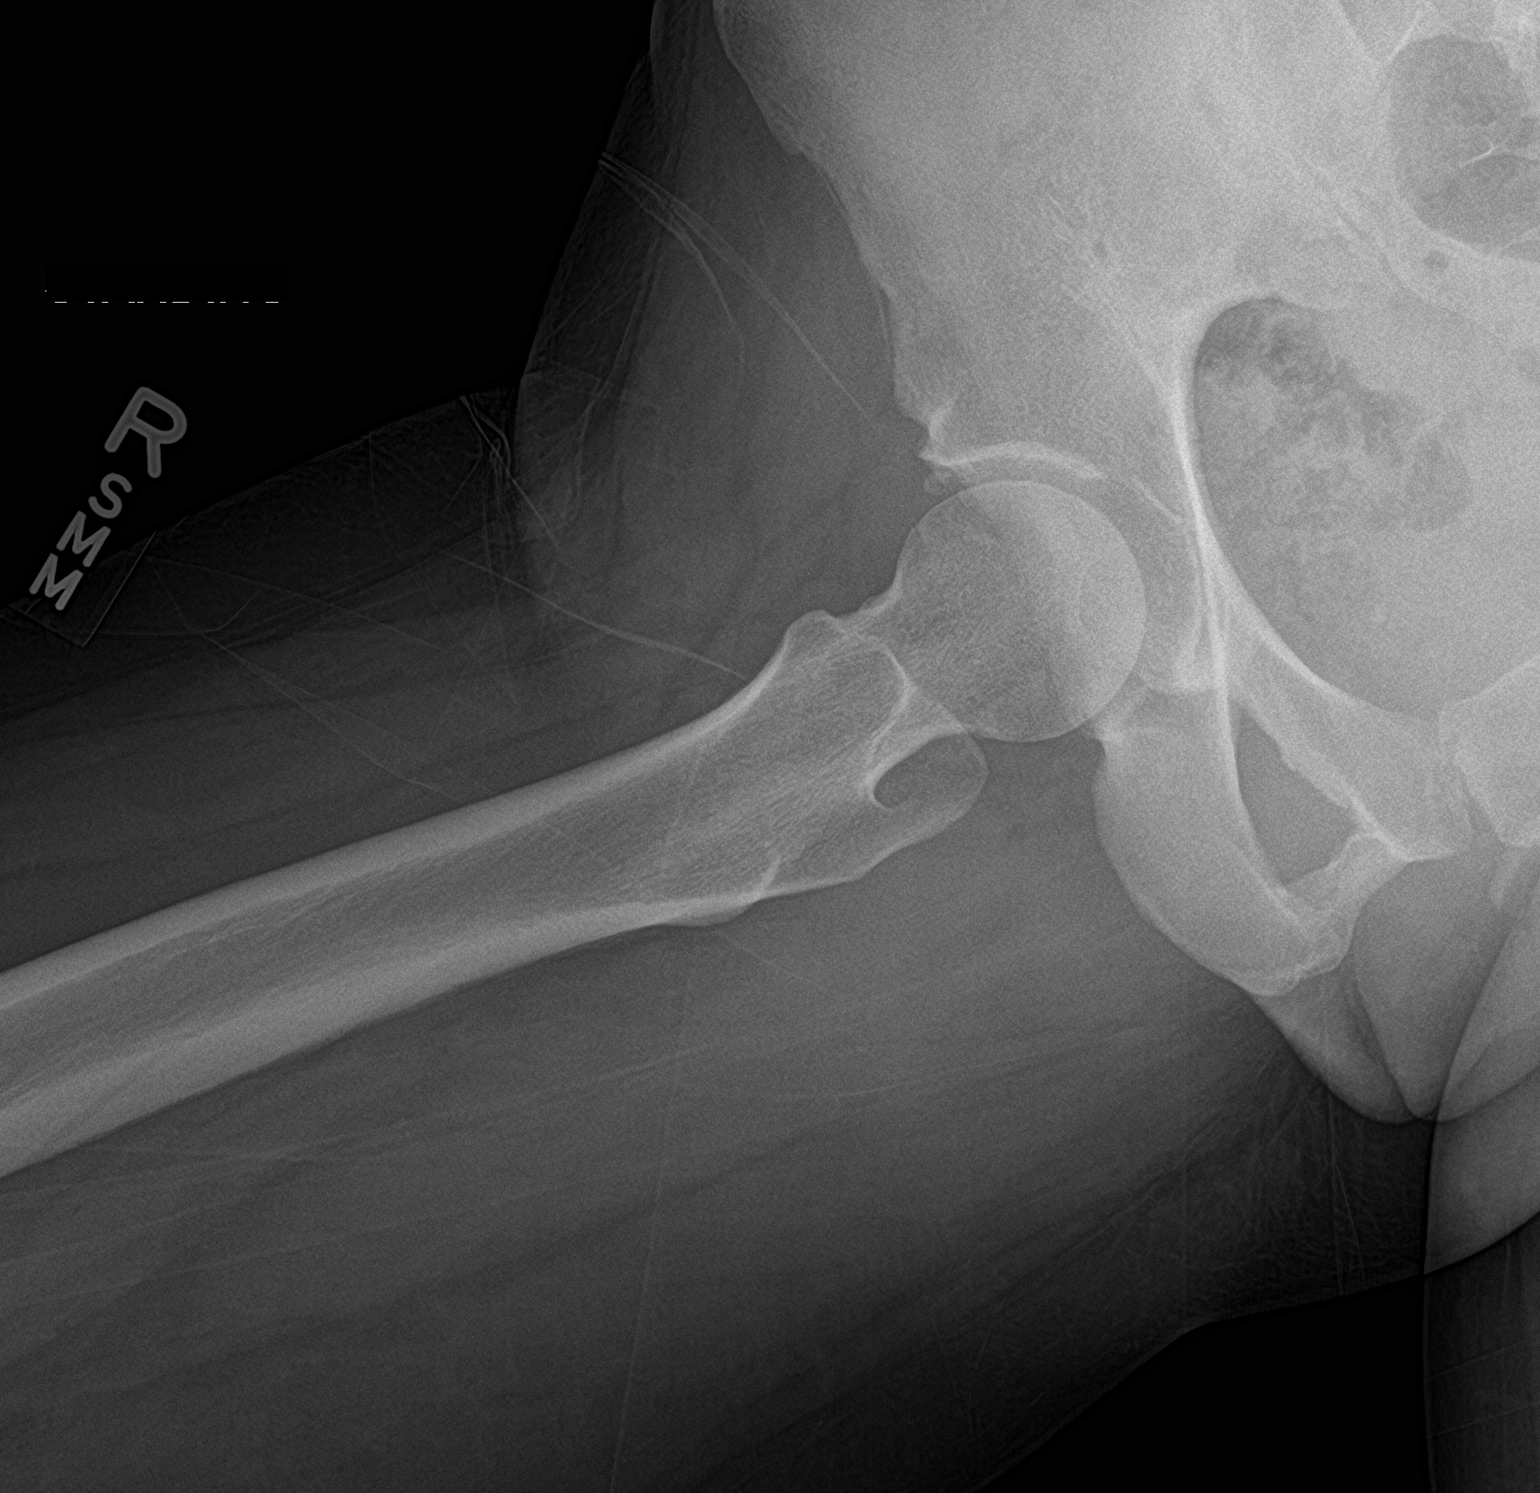

[2 of 2 positions shown; findings below may reference images not displayed]

FINDINGS: The right hip is normally located. No hip fracture or AVN. No
significant degenerative changes. Os acetabuli noted. The visualized
right hemipelvis is intact.
IMPRESSION: No acute bony findings.

## 2018-07-19 IMAGING — DX DG HIP (WITH OR WITHOUT PELVIS) 2-3V*R*
3 series · 3 of 3 positions shown · non-contrast
Comparison: 05/13/2016

CLINICAL DATA: Nontraumatic anterior right hip pain, greatest with
internal rotation. Recent steroid injection.

EXAM:
DG HIP (WITH OR WITHOUT PELVIS) 2-3V RIGHT

[t pelvis ap]
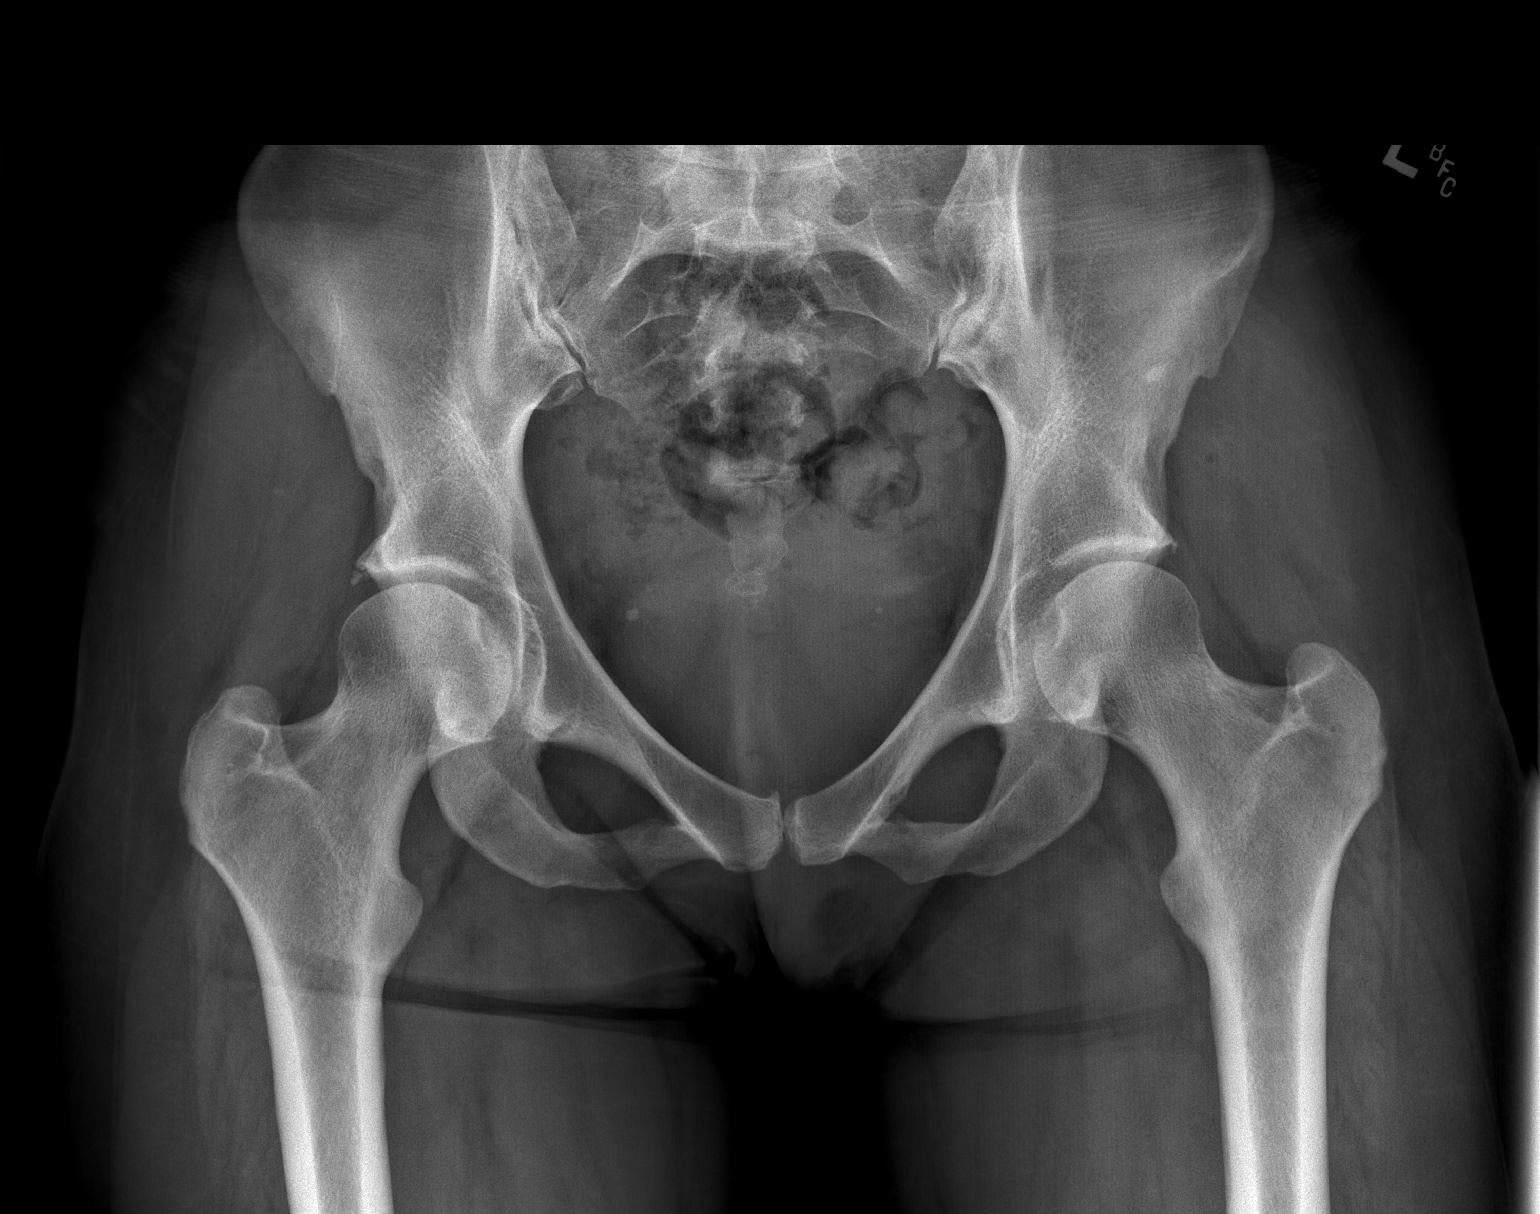

[t hip ap right]
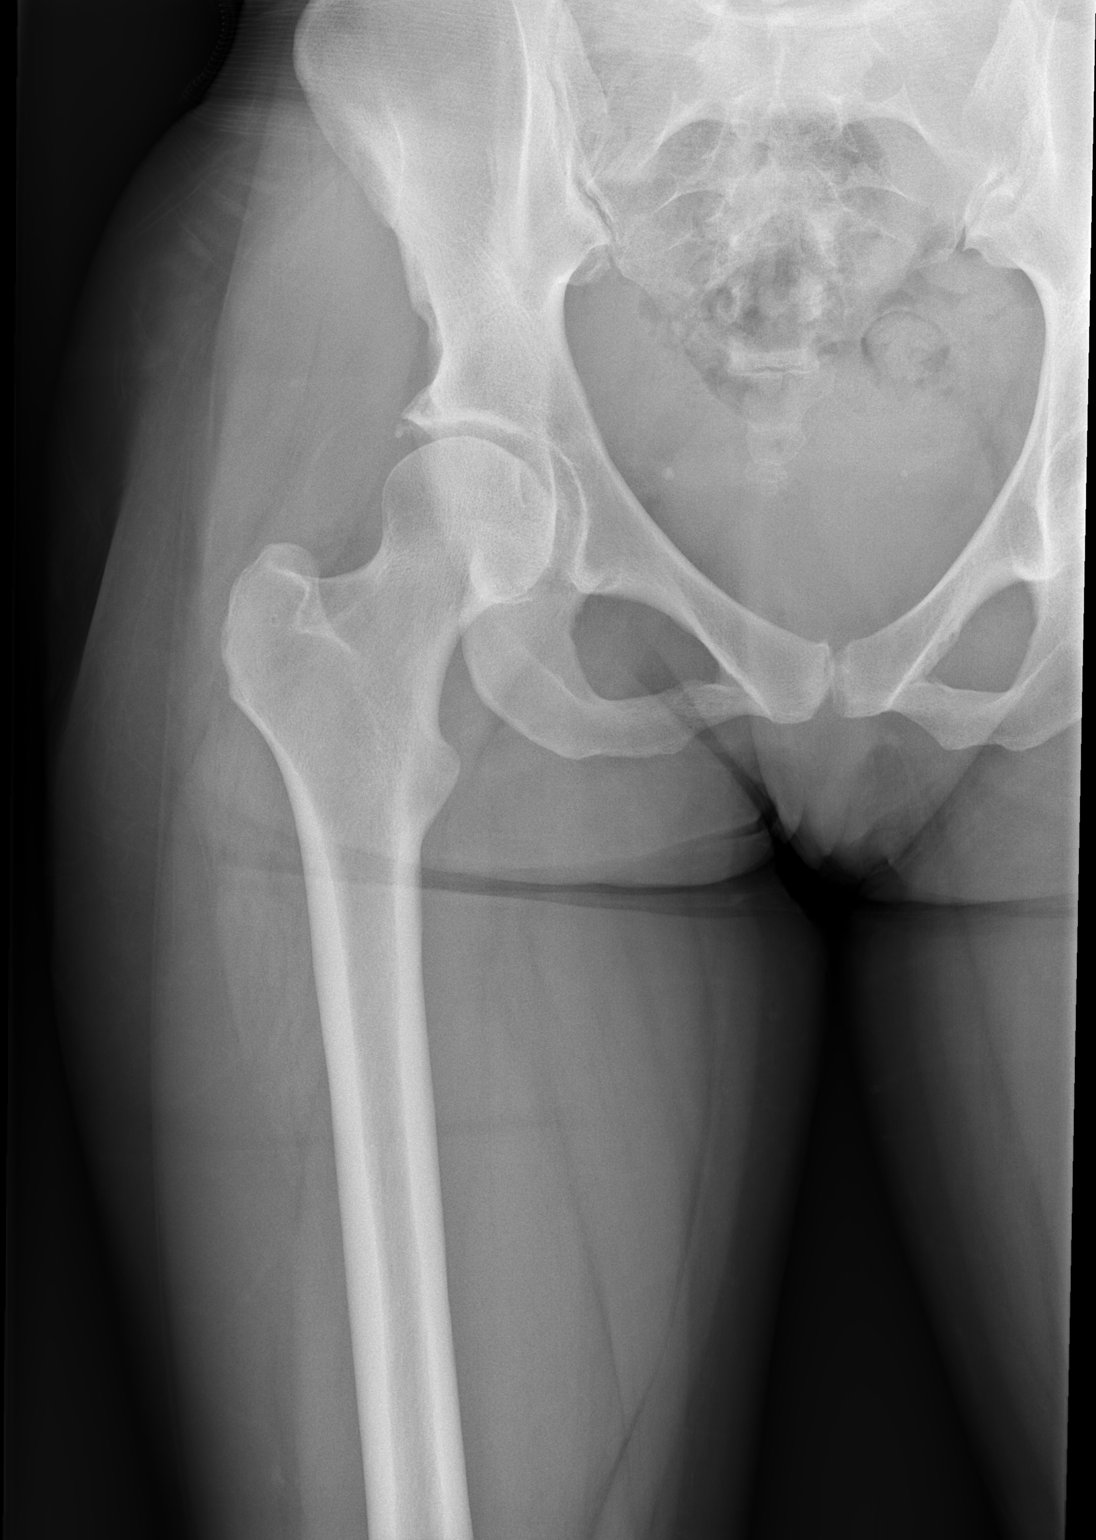

[t hip frog leg right]
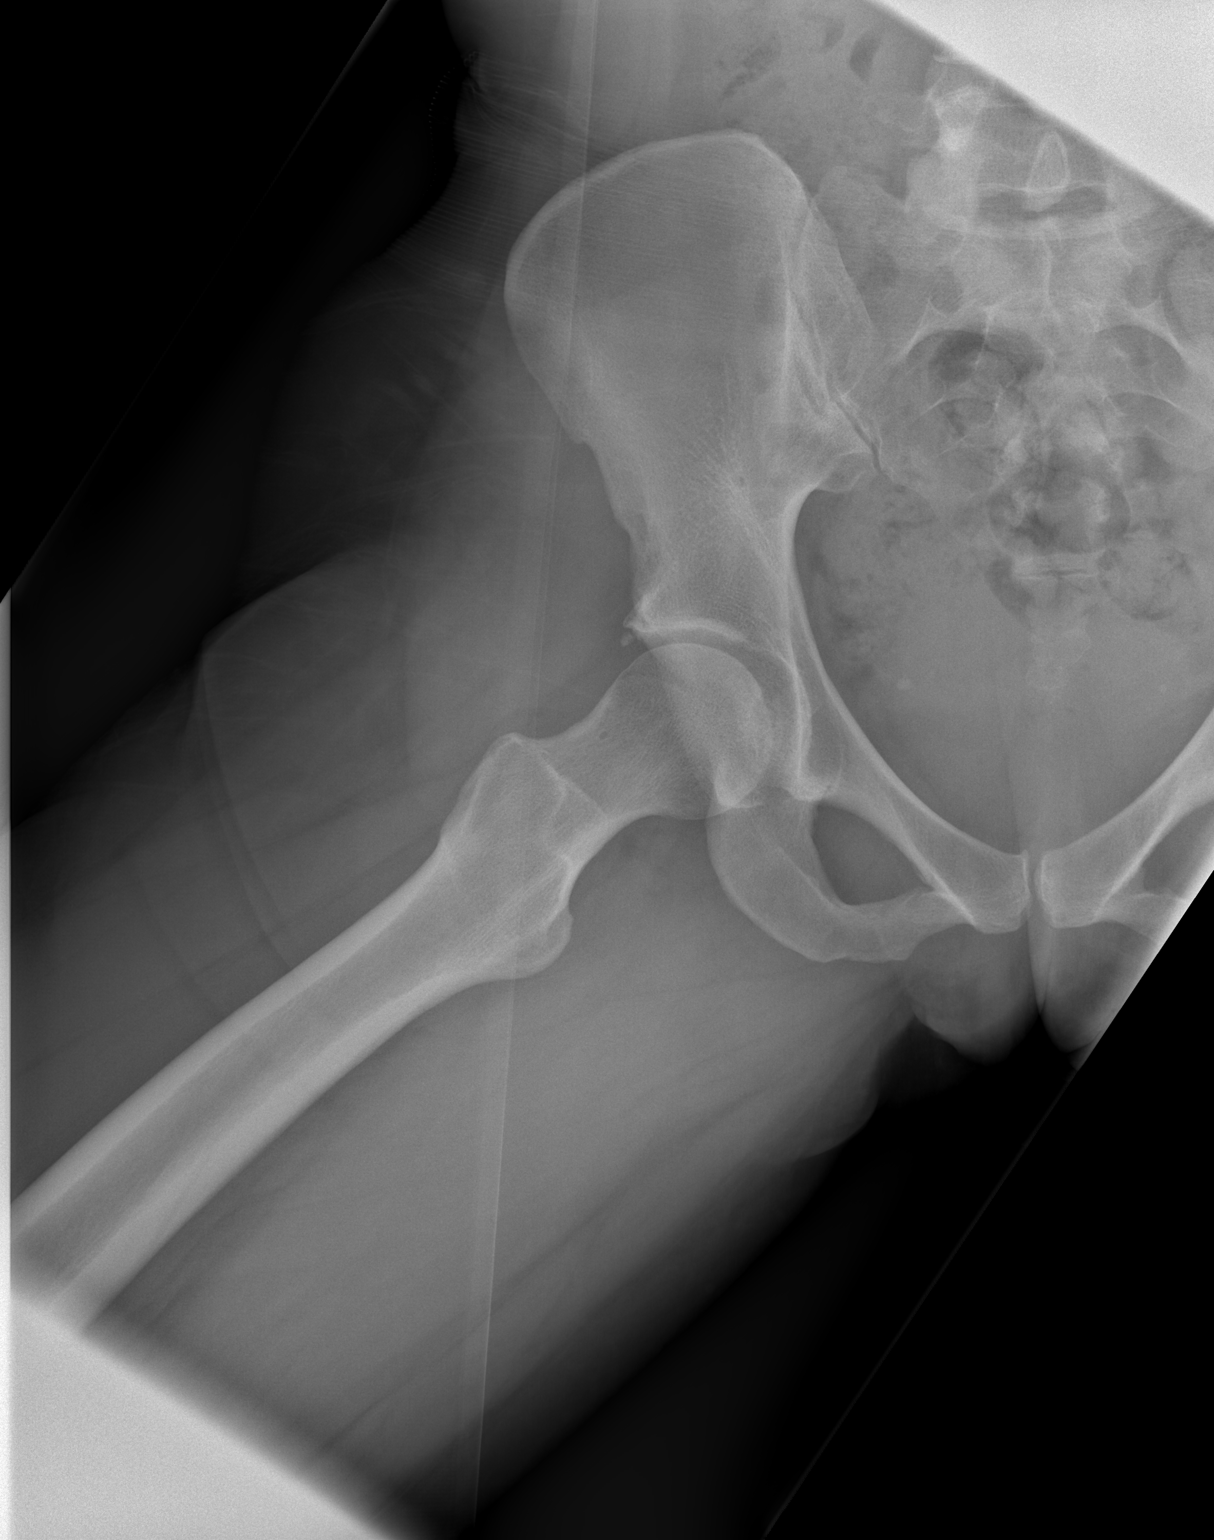

[3 of 3 positions shown; findings below may reference images not displayed]

FINDINGS: There is no evidence of hip fracture or dislocation. Os acetabuli
noted. No arthritic changes. No bone lesion or bony destruction. No
interval change.
IMPRESSION: Negative.

## 2021-04-10 NOTE — Patient Instructions (Signed)
DUE TO COVID-19 ONLY ONE VISITOR  (aged 32 and older)  IS ALLOWED TO COME WITH YOU AND STAY IN THE WAITING ROOM ONLY DURING PRE OP AND PROCEDURE.   **NO VISITORS ARE ALLOWED IN THE SHORT STAY AREA OR RECOVERY ROOM!!**  IF YOU WILL BE ADMITTED INTO THE HOSPITAL YOU ARE ALLOWED ONLY TWO SUPPORT PEOPLE DURING VISITATION HOURS ONLY (7 AM -8PM)   The support person(s) must pass our screening, gel in and out, and wear a mask at all times, including in the patients room. Patients must also wear a mask when staff or their support person are in the room. Visitors GUEST BADGE MUST BE WORN VISIBLY  One adult visitor may remain with you overnight and MUST be in the room by 8 P.M.          Your procedure is scheduled on: 04/23/21   Report to [facility] Main Entrance    Report to admitting at  7:45 AM   Call this number if you have problems the morning of surgery [phone]   Do not eat food :After Midnight.   After Midnight you may have the following liquids until _7:00_____ AM DAY OF SURGERY  Water Black Coffee (sugar ok, NO MILK/CREAM OR CREAMERS)  Tea (sugar ok, NO MILK/CREAM OR CREAMERS) regular and decaf                             Plain Jell-O (NO RED)                                           Fruit ices (not with fruit pulp, NO RED)                                     Popsicles (NO RED)                                                                  Juice: apple, WHITE grape, WHITE cranberry Sports drinks like Gatorade (NO RED) Clear broth(vegetable,chicken,beef)                    The day of surgery:  Drink ONE (1) Pre-Surgery Clear Ensure  at  6:45 AM the morning of surgery. Drink in one sitting. Do not sip.  This drink was given to you during your hospital  pre-op appointment visit. Nothing else to drink after completing the  Pre-Surgery Clear Ensure at 7:00 am          If you have questions, please contact your surgeons office.   FOLLOW BOWEL PREP AND ANY  ADDITIONAL PRE OP INSTRUCTIONS YOU RECEIVED FROM YOUR SURGEON'S OFFICE!!!     Oral Hygiene is also important to reduce your risk of infection.                                    Remember - BRUSH YOUR TEETH THE MORNING OF SURGERY WITH YOUR REGULAR TOOTHPASTE  Do NOT smoke after Midnight   Take these medicines the morning of surgery with A SIP OF WATER: none                                You may not have any metal on your body including hair pins, jewelry, and body piercing             Do not wear make-up, lotions, powders, perfumes/cologne, or deodorant  Do not wear nail polish including gel and S&S, artificial/acrylic nails, or any other type of covering on natural nails including finger and toenails. If you have artificial nails, gel coating, etc. that needs to be removed by a nail salon please have this removed prior to surgery or surgery may need to be canceled/ delayed if the surgeon/ anesthesia feels like they are unable to be safely monitored.   Do not shave  48 hours prior to surgery.     Do not bring valuables to the hospital. [place].   Contacts, dentures or bridgework may not be worn into surgery.     Patients discharged on the day of surgery will not be allowed to drive home.  Someone NEEDS to stay with you for the first 24 hours after anesthesia.   Special Instructions: Bring a copy of your healthcare power of attorney and living will documents the day of surgery if you haven't scanned them before.              Please read over the following fact sheets you were given: IF YOU HAVE QUESTIONS ABOUT YOUR PRE-OP INSTRUCTIONS PLEASE CALL [phone]      [facility] - Preparing for Surgery Before surgery, you can play an important role.  Because skin is not sterile, your skin needs to be as free of germs as possible.  You can reduce the number of germs on your skin by washing with CHG (chlorahexidine gluconate) soap  before surgery.  CHG is an antiseptic cleaner which kills germs and bonds with the skin to continue killing germs even after washing. Please DO NOT use if you have an allergy to CHG or antibacterial soaps.  If your skin becomes reddened/irritated stop using the CHG and inform your nurse when you arrive at Short Stay. Do not shave (including legs and underarms) for at least 48 hours prior to the first CHG shower.  Please follow these instructions carefully:  1.  Shower with CHG Soap the night before surgery and the  morning of Surgery.  2.  If you choose to wash your hair, wash your hair first as usual with your  normal  shampoo.  3.  After you shampoo, rinse your hair and body thoroughly to remove the  shampoo.                            4.  Use CHG as you would any other liquid soap.  You can apply chg directly  to the skin and wash                       Gently with a scrungie or clean washcloth.  5.  Apply the CHG Soap to your body ONLY FROM THE NECK DOWN.   Do not use on face/ open  Wound or open sores. Avoid contact with eyes, ears mouth and genitals (private parts).                       Wash face,  Genitals (private parts) with your normal soap.             6.  Wash thoroughly, paying special attention to the area where your surgery  will be performed.  7.  Thoroughly rinse your body with warm water from the neck down.  8.  DO NOT shower/wash with your normal soap after using and rinsing off  the CHG Soap.                9.  Pat yourself dry with a clean towel.            10.  Wear clean pajamas.            11.  Place clean sheets on your bed the night of your first shower and do not  sleep with pets. Day of Surgery : Do not apply any lotions/deodorants the morning of surgery.  Please wear clean clothes to the hospital/surgery center.  FAILURE TO FOLLOW THESE INSTRUCTIONS MAY RESULT IN THE CANCELLATION OF YOUR SURGERY PATIENT  SIGNATURE_________________________________  NURSE SIGNATURE__________________________________  ________________________________________________________________________   Regina Griffith  An incentive spirometer is a tool that can help keep your lungs clear and active. This tool measures how well you are filling your lungs with each breath. Taking long deep breaths may help reverse or decrease the chance of developing breathing (pulmonary) problems (especially infection) following: A long period of time when you are unable to move or be active. BEFORE THE PROCEDURE  If the spirometer includes an indicator to show your best effort, your nurse or respiratory therapist will set it to a desired goal. If possible, sit up straight or lean slightly forward. Try not to slouch. Hold the incentive spirometer in an upright position. INSTRUCTIONS FOR USE  Sit on the edge of your bed if possible, or sit up as far as you can in bed or on a chair. Hold the incentive spirometer in an upright position. Breathe out normally. Place the mouthpiece in your mouth and seal your lips tightly around it. Breathe in slowly and as deeply as possible, raising the piston or the ball toward the top of the column. Hold your breath for 3-5 seconds or for as long as possible. Allow the piston or ball to fall to the bottom of the column. Remove the mouthpiece from your mouth and breathe out normally. Rest for a few seconds and repeat Steps 1 through 7 at least 10 times every 1-2 hours when you are awake. Take your time and take a few normal breaths between deep breaths. The spirometer may include an indicator to show your best effort. Use the indicator as a goal to work toward during each repetition. After each set of 10 deep breaths, practice coughing to be sure your lungs are clear. If you have an incision (the cut made at the time of surgery), support your incision when coughing by placing a pillow or rolled up towels  firmly against it. Once you are able to get out of bed, walk around indoors and cough well. You may stop using the incentive spirometer when instructed by your caregiver.  RISKS AND COMPLICATIONS Take your time so you do not get dizzy or light-headed. If you are in pain, you may need to take or  ask for pain medication before doing incentive spirometry. It is harder to take a deep breath if you are having pain. AFTER USE Rest and breathe slowly and easily. It can be helpful to keep track of a log of your progress. Your caregiver can provide you with a simple table to help with this. If you are using the spirometer at home, follow these instructions: [place] IF:  You are having difficultly using the spirometer. You have trouble using the spirometer as often as instructed. Your pain medication is not giving enough relief while using the spirometer. You develop fever of 100.5 F (38.1 C) or higher. SEEK IMMEDIATE MEDICAL CARE IF:  You cough up bloody sputum that had not been present before. You develop fever of 102 F (38.9 C) or greater. You develop worsening pain at or near the incision site. MAKE SURE YOU:  Understand these instructions. Will watch your condition. Will get help right away if you are not doing well or get worse. Document Released: 06/01/2006 Document Revised: 04/13/2011 Document Reviewed: 08/02/2006 [employer] Patient Information 2014 [place], [place].   ________________________________________________________________________

## 2021-04-11 ENCOUNTER — Encounter (HOSPITAL_COMMUNITY)
Admission: RE | Admit: 2021-04-11 | Discharge: 2021-04-11 | Disposition: A | Discharge status: Home / Self Care | Payer: PRIVATE HEALTH INSURANCE | Source: Ambulatory Visit | Attending: Anesthesiology | Admitting: Anesthesiology

## 2021-04-11 DIAGNOSIS — Z01818 Encounter for other preprocedural examination: Secondary | ICD-10-CM

## 2021-04-11 NOTE — Progress Notes (Signed)
Pt didn't show for her PAT appointment on Friday 04/11/21. I tried her numbers . No aswer and not able to leave a message ?

## 2021-04-17 NOTE — Progress Notes (Addendum)
DUE TO COVID-19 ONLY ONE VISITOR IS ALLOWED TO COME WITH YOU AND STAY IN THE WAITING ROOM ONLY DURING PRE OP AND PROCEDURE DAY OF SURGERY.  2 VISITOR  MAY VISIT WITH YOU AFTER SURGERY IN YOUR PRIVATE ROOM DURING VISITING HOURS ONLY! ?YOU MAY HAVE ONE PERSON SPEND THE NITE WITH YOU IN YOUR ROOM AFTER SURGERY.   ? ? ? Your procedure is scheduled on:  ?       04/23/21  ? Report to [facility] Main  Entrance ? ? Report to admitting at    0745              AM ?DO NOT BRING INSURANCE CARD, PICTURE ID OR WALLET DAY OF SURGERY.  ?  ? ? Call this number if you have problems the morning of surgery [phone]  ? ? REMEMBER: NO  SOLID FOODS , CANDY, GUM OR MINTS AFTER MIDNITE THE NITE BEFORE SURGERY .       Mar[place] Kitchen CLEAR LIQUIDS UNTIL       0700am          DAY OF SURGERY.      PLEASE FINISH ENSURE DRINK PER SURGEON ORDER  WHICH NEEDS TO BE COMPLETED AT  0700am        MORNING OF SURGERY.   ? ? ? ? ?CLEAR LIQUID DIET ? ? ?Foods Allowed      ?WATER ?BLACK COFFEE ( SUGAR OK, NO MILK, CREAM OR CREAMER) REGULAR AND DECAF  ?TEA ( SUGAR OK NO MILK, CREAM, OR CREAMER) REGULAR AND DECAF  ?PLAIN JELLO ( NO RED)  ?FRUIT ICES ( NO RED, NO FRUIT PULP)  ?POPSICLES ( NO RED)  ?JUICE- APPLE, WHITE GRAPE AND WHITE CRANBERRY  ?SPORT DRINK LIKE GATORADE ( NO RED)  ?CLEAR BROTH ( VEGETABLE , CHICKEN OR BEEF)                                                               ? ?    ? ?BRUSH YOUR TEETH MORNING OF SURGERY AND RINSE YOUR MOUTH OUT, NO CHEWING GUM CANDY OR MINTS. ?  ? ? Take these medicines the morning of surgery with A SIP OF WATER:  inhalers as usual and bring  ? ? ?DO NOT TAKE ANY DIABETIC MEDICATIONS DAY OF YOUR SURGERY ?                  ?            You may not have any metal on your body including hair pins and  ?            piercings  Do not wear jewelry, make-up, lotions, powders or perfumes, deodorant ?            Do not wear nail polish on your fingernails.   ?           IF YOU ARE A FEMALE AND WANT TO SHAVE UNDER ARMS OR LEGS  PRIOR TO SURGERY YOU MUST DO SO AT LEAST 48 HOURS PRIOR TO SURGERY.  ?            Men may shave face and neck. ? ? Do not bring valuables to the hospital. [facility] IS NOT ?  RESPONSIBLE   FOR VALUABLES. ? Contacts, dentures or bridgework may not be worn into surgery. ? Leave suitcase in the car. After surgery it may be brought to your room. ? ?  ? Patients discharged the day of surgery will not be allowed to drive home. IF YOU ARE HAVING SURGERY AND GOING HOME THE SAME DAY, YOU MUST HAVE AN ADULT TO DRIVE YOU HOME AND BE WITH YOU FOR 24 HOURS. YOU MAY GO HOME BY TAXI OR UBER OR ORTHERWISE, BUT AN ADULT MUST ACCOMPANY YOU HOME AND STAY WITH YOU FOR 24 HOURS. ?  ? ?            Please read over the following fact sheets you were given: ?_____________________________________________________________________ ? ?Regina Griffith - Preparing for Surgery ?Before surgery, you can play an important role.  Because skin is not sterile, your skin needs to be as free of germs as possible.  You can reduce the number of germs on your skin by washing with CHG (chlorahexidine gluconate) soap before surgery.  CHG is an antiseptic cleaner which kills germs and bonds with the skin to continue killing germs even after washing. ?Please DO NOT use if you have an allergy to CHG or antibacterial soaps.  If your skin becomes reddened/irritated stop using the CHG and inform your nurse when you arrive at Short Stay. ?Do not shave (including legs and underarms) for at least 48 hours prior to the first CHG shower.  You may shave your face/neck. ?Please follow these instructions carefully: ? 1.  Shower with CHG Soap the night before surgery and the  morning of Surgery. ? 2.  If you choose to wash your hair, wash your hair first as usual with your  normal  shampoo. ? 3.  After you shampoo, rinse your hair and body thoroughly to remove the  shampoo.                           4.  Use CHG as you would any other liquid soap.  You can apply chg  directly  to the skin and wash  ?                     Gently with a scrungie or clean washcloth. ? 5.  Apply the CHG Soap to your body ONLY FROM THE NECK DOWN.   Do not use on face/ open      ?                     Wound or open sores. Avoid contact with eyes, ears mouth and genitals (private parts).  ?                     Production manager,  Genitals (private parts) with your normal soap. ?            6.  Wash thoroughly, paying special attention to the area where your surgery  will be performed. ? 7.  Thoroughly rinse your body with warm water from the neck down. ? 8.  DO NOT shower/wash with your normal soap after using and rinsing off  the CHG Soap. ?               9.  Pat yourself dry with a clean towel. ?           10.  Wear clean pajamas. ?  11.  Place clean sheets on your bed the night of your first shower and do not  sleep with pets. ?Day of Surgery : ?Do not apply any lotions/deodorants the morning of surgery.  Please wear clean clothes to the hospital/surgery center. ? ?FAILURE TO FOLLOW THESE INSTRUCTIONS MAY RESULT IN THE CANCELLATION OF YOUR SURGERY ?PATIENT SIGNATURE_________________________________ ? ?NURSE SIGNATURE__________________________________ ? ?________________________________________________________________________  ? ? ?           ?

## 2021-04-17 NOTE — Progress Notes (Addendum)
Anesthesia Review: ? ?PCP: none  ?Cardiologist :none  ?Chest x-ray : ?EKG : ?Echo : ?Stress test: ?Cardiac Cath :  ?Activity level: can do a flgiht of stairs without difficulty  ?Sleep Study/ CPAP : none  ?Fasting Blood Sugar :      / Checks Blood Sugar -- times a day:   ?Blood Thinner/ Instructions /Last Dose: ?ASA / Instructions/ Last Dose :   ?Positive for covid on 01/27/21- in epic  ?PT reports crutches are broken at preop appt.  Instructed pt to go by office of DR Thomasena Edis prior to surgery and obtain new crutches.  PT voiced understanding.  ?Positive for covid on 01/27/21.   ?

## 2021-04-21 ENCOUNTER — Encounter (HOSPITAL_COMMUNITY)
Admission: RE | Admit: 2021-04-21 | Discharge: 2021-04-21 | Disposition: A | Discharge status: Home / Self Care | Payer: Medicaid Other | Source: Ambulatory Visit | Attending: Specialist | Admitting: Specialist

## 2021-04-21 ENCOUNTER — Other Ambulatory Visit: Payer: Self-pay

## 2021-04-21 ENCOUNTER — Encounter (HOSPITAL_COMMUNITY): Payer: Self-pay

## 2021-04-21 DIAGNOSIS — Z01812 Encounter for preprocedural laboratory examination: Secondary | ICD-10-CM | POA: Diagnosis present

## 2021-04-21 DIAGNOSIS — S83511A Sprain of anterior cruciate ligament of right knee, initial encounter: Secondary | ICD-10-CM | POA: Diagnosis not present

## 2021-04-21 DIAGNOSIS — X58XXXA Exposure to other specified factors, initial encounter: Secondary | ICD-10-CM | POA: Insufficient documentation

## 2021-04-21 HISTORY — DX: Anxiety disorder, unspecified: F41.9

## 2021-04-21 LAB — COMPREHENSIVE METABOLIC PANEL
ALT: 17 U/L (ref 0–44)
AST: 19 U/L (ref 15–41)
Albumin: 4.3 g/dL (ref 3.5–5.0)
Alkaline Phosphatase: 39 U/L (ref 38–126)
Anion gap: 7 (ref 5–15)
BUN: 7 mg/dL (ref 6–20)
CO2: 27 mmol/L (ref 22–32)
Calcium: 9.2 mg/dL (ref 8.9–10.3)
Chloride: 102 mmol/L (ref 98–111)
Creatinine, Ser: 0.67 mg/dL (ref 0.44–1.00)
GFR, Estimated: >60 mL/min (ref >60)
Glucose, Bld: 77 mg/dL (ref 70–99)
Potassium: 3.9 mmol/L (ref 3.5–5.1)
Sodium: 136 mmol/L (ref 135–145)
Total Bilirubin: 0.3 mg/dL (ref 0.3–1.2)
Total Protein: 7.9 g/dL (ref 6.5–8.1)

## 2021-04-21 LAB — CBC
HCT: 41.1 % (ref 36.0–46.0)
Hemoglobin: 13.3 g/dL (ref 12.0–15.0)
MCH: 29.8 pg (ref 26.0–34.0)
MCHC: 32.4 g/dL (ref 30.0–36.0)
MCV: 91.9 fL (ref 80.0–100.0)
Platelets: 366 10*3/uL (ref 150–400)
RBC: 4.47 MIL/uL (ref 3.87–5.11)
RDW: 13.3 % (ref 11.5–15.5)
WBC: 7 10*3/uL (ref 4.0–10.5)
nRBC: 0 % (ref 0.0–0.2)

## 2021-04-23 ENCOUNTER — Encounter (HOSPITAL_COMMUNITY)
Admission: RE | Disposition: A | Discharge status: Home / Self Care | Payer: Self-pay | Source: Home / Self Care | Attending: Specialist

## 2021-04-23 ENCOUNTER — Other Ambulatory Visit: Payer: Self-pay

## 2021-04-23 ENCOUNTER — Encounter (HOSPITAL_COMMUNITY): Payer: Self-pay | Admitting: Specialist

## 2021-04-23 ENCOUNTER — Ambulatory Visit (HOSPITAL_BASED_OUTPATIENT_CLINIC_OR_DEPARTMENT_OTHER): Payer: Medicaid Other | Admitting: Certified Registered Nurse Anesthetist

## 2021-04-23 ENCOUNTER — Ambulatory Visit (HOSPITAL_COMMUNITY): Payer: Medicaid Other | Admitting: Physician Assistant

## 2021-04-23 ENCOUNTER — Ambulatory Visit (HOSPITAL_COMMUNITY)
Admission: RE | Admit: 2021-04-23 | Discharge: 2021-04-23 | Disposition: A | Discharge status: Home / Self Care | Payer: Medicaid Other | Attending: Specialist | Admitting: Specialist

## 2021-04-23 DIAGNOSIS — M23351 Other meniscus derangements, posterior horn of lateral meniscus, right knee: Secondary | ICD-10-CM | POA: Diagnosis not present

## 2021-04-23 DIAGNOSIS — S83261A Peripheral tear of lateral meniscus, current injury, right knee, initial encounter: Secondary | ICD-10-CM | POA: Diagnosis not present

## 2021-04-23 DIAGNOSIS — Z01818 Encounter for other preprocedural examination: Secondary | ICD-10-CM

## 2021-04-23 DIAGNOSIS — S83511A Sprain of anterior cruciate ligament of right knee, initial encounter: Secondary | ICD-10-CM | POA: Insufficient documentation

## 2021-04-23 HISTORY — PX: KNEE ARTHROSCOPY WITH ANTERIOR CRUCIATE LIGAMENT (ACL) REPAIR: SHX5644

## 2021-04-23 LAB — PREGNANCY, URINE: Preg Test, Ur: NEGATIVE

## 2021-04-23 SURGERY — KNEE ARTHROSCOPY WITH ANTERIOR CRUCIATE LIGAMENT (ACL) REPAIR
Anesthesia: Regional | Site: Knee | Laterality: Right

## 2021-04-23 MED ORDER — CHLORHEXIDINE GLUCONATE 0.12 % MT SOLN
15.0000 mL | Freq: Once | OROMUCOSAL | Status: AC
  Administered 2021-04-23: 15 mL via OROMUCOSAL

## 2021-04-23 MED ORDER — BUPIVACAINE-EPINEPHRINE (PF) 0.25% -1:200000 IJ SOLN
INTRAMUSCULAR | Status: DC | PRN
  Administered 2021-04-23: 20 mL via PERINEURAL
  Administered 2021-04-23: 30 mL via PERINEURAL

## 2021-04-23 MED ORDER — KETOROLAC TROMETHAMINE 30 MG/ML IJ SOLN: 30.0000 mg | Freq: Once | INTRAMUSCULAR | Status: AC

## 2021-04-23 MED ORDER — FENTANYL CITRATE (PF) 100 MCG/2ML IJ SOLN
INTRAMUSCULAR | Status: AC
Start: 2021-04-23 — End: ?
  Filled 2021-04-23: qty 2

## 2021-04-23 MED ORDER — FENTANYL CITRATE (PF) 100 MCG/2ML IJ SOLN
INTRAMUSCULAR | Status: AC
  Filled 2021-04-23: qty 2

## 2021-04-23 MED ORDER — AMISULPRIDE (ANTIEMETIC) 5 MG/2ML IV SOLN: 10.0000 mg | Freq: Once | INTRAVENOUS | Status: DC | PRN

## 2021-04-23 MED ORDER — MIDAZOLAM HCL 2 MG/2ML IJ SOLN
INTRAMUSCULAR | Status: AC
Start: 2021-04-23 — End: ?
  Filled 2021-04-23: qty 2

## 2021-04-23 MED ORDER — DEXAMETHASONE SODIUM PHOSPHATE 10 MG/ML IJ SOLN
INTRAMUSCULAR | Status: AC
  Filled 2021-04-23: qty 1

## 2021-04-23 MED ORDER — DEXMEDETOMIDINE (PRECEDEX) IN NS 20 MCG/5ML (4 MCG/ML) IV SYRINGE
PREFILLED_SYRINGE | INTRAVENOUS | Status: AC
  Filled 2021-04-23: qty 5

## 2021-04-23 MED ORDER — ONDANSETRON HCL 4 MG PO TABS: 4.0000 mg | ORAL_TABLET | Freq: Every day | ORAL | 1 refills | Status: AC | PRN

## 2021-04-23 MED ORDER — ACETAMINOPHEN 10 MG/ML IV SOLN: 1000.0000 mg | Freq: Once | INTRAVENOUS | Status: DC | PRN

## 2021-04-23 MED ORDER — TRAMADOL HCL 50 MG PO TABS: 50.0000 mg | ORAL_TABLET | Freq: Four times a day (QID) | ORAL | 0 refills | Status: AC | PRN

## 2021-04-23 MED ORDER — VANCOMYCIN HCL 250 MG PO CAPS
250.0000 mg | ORAL_CAPSULE | Freq: Three times a day (TID) | ORAL | 0 refills | Status: AC
Start: 2021-04-23 — End: 2021-04-26

## 2021-04-23 MED ORDER — PROPOFOL 10 MG/ML IV BOLUS
INTRAVENOUS | Status: AC
  Filled 2021-04-23: qty 20

## 2021-04-23 MED ORDER — OXYCODONE HCL 5 MG PO TABS
ORAL_TABLET | ORAL | Status: AC
  Administered 2021-04-23: 5 mg via ORAL
  Filled 2021-04-23: qty 1

## 2021-04-23 MED ORDER — LIDOCAINE 2% (20 MG/ML) 5 ML SYRINGE
INTRAMUSCULAR | Status: DC | PRN
  Administered 2021-04-23: 40 mg via INTRAVENOUS

## 2021-04-23 MED ORDER — FENTANYL CITRATE PF 50 MCG/ML IJ SOSY
50.0000 ug | PREFILLED_SYRINGE | INTRAMUSCULAR | Status: DC
  Administered 2021-04-23: 100 ug via INTRAVENOUS
  Filled 2021-04-23: qty 2

## 2021-04-23 MED ORDER — BUPIVACAINE HCL (PF) 0.25 % IJ SOLN
INTRAMUSCULAR | Status: DC | PRN
Start: 2021-04-23 — End: 2021-04-23
  Administered 2021-04-23: 30 mL

## 2021-04-23 MED ORDER — OXYCODONE HCL 5 MG PO TABS: 5.0000 mg | ORAL_TABLET | Freq: Once | ORAL | Status: AC | PRN

## 2021-04-23 MED ORDER — ACETAMINOPHEN 10 MG/ML IV SOLN
INTRAVENOUS | Status: AC
  Administered 2021-04-23: 1000 mg via INTRAVENOUS
  Filled 2021-04-23: qty 100

## 2021-04-23 MED ORDER — ORAL CARE MOUTH RINSE
15.0000 mL | Freq: Once | OROMUCOSAL | Status: AC
Start: 2021-04-23 — End: 2021-04-23

## 2021-04-23 MED ORDER — HYDROMORPHONE HCL 1 MG/ML IJ SOLN
0.2500 mg | INTRAMUSCULAR | Status: DC | PRN
  Administered 2021-04-23 (×3): 0.5 mg via INTRAVENOUS

## 2021-04-23 MED ORDER — LIDOCAINE HCL (PF) 2 % IJ SOLN
INTRAMUSCULAR | Status: AC
  Filled 2021-04-23: qty 5

## 2021-04-23 MED ORDER — LACTATED RINGERS IV SOLN: INTRAVENOUS | Status: DC

## 2021-04-23 MED ORDER — VANCOMYCIN HCL IN DEXTROSE 1-5 GM/200ML-% IV SOLN
1000.0000 mg | INTRAVENOUS | Status: AC
  Administered 2021-04-23: 1000 mg via INTRAVENOUS
  Filled 2021-04-23: qty 200

## 2021-04-23 MED ORDER — BUPIVACAINE HCL 0.25 % IJ SOLN
INTRAMUSCULAR | Status: AC
  Filled 2021-04-23: qty 1

## 2021-04-23 MED ORDER — MIDAZOLAM HCL 2 MG/2ML IJ SOLN
1.0000 mg | INTRAMUSCULAR | Status: DC
  Administered 2021-04-23: 2 mg via INTRAVENOUS
  Filled 2021-04-23: qty 2

## 2021-04-23 MED ORDER — ONDANSETRON HCL 4 MG/2ML IJ SOLN
INTRAMUSCULAR | Status: AC
  Filled 2021-04-23: qty 2

## 2021-04-23 MED ORDER — METHOCARBAMOL 500 MG PO TABS: 500.0000 mg | ORAL_TABLET | Freq: Four times a day (QID) | ORAL | 0 refills | Status: AC

## 2021-04-23 MED ORDER — DEXMEDETOMIDINE (PRECEDEX) IN NS 20 MCG/5ML (4 MCG/ML) IV SYRINGE
PREFILLED_SYRINGE | INTRAVENOUS | Status: DC | PRN
  Administered 2021-04-23: 8 ug via INTRAVENOUS
  Administered 2021-04-23: 4 ug via INTRAVENOUS
  Administered 2021-04-23: 8 ug via INTRAVENOUS

## 2021-04-23 MED ORDER — FENTANYL CITRATE (PF) 100 MCG/2ML IJ SOLN
INTRAMUSCULAR | Status: DC | PRN
  Administered 2021-04-23: 25 ug via INTRAVENOUS
  Administered 2021-04-23: 50 ug via INTRAVENOUS
  Administered 2021-04-23: 25 ug via INTRAVENOUS
  Administered 2021-04-23: 50 ug via INTRAVENOUS
  Administered 2021-04-23 (×2): 25 ug via INTRAVENOUS

## 2021-04-23 MED ORDER — OXYCODONE HCL 5 MG PO TABS
5.0000 mg | ORAL_TABLET | ORAL | 0 refills | Status: AC | PRN
Start: 2021-04-23 — End: 2022-04-23

## 2021-04-23 MED ORDER — ONDANSETRON HCL 4 MG/2ML IJ SOLN
INTRAMUSCULAR | Status: DC | PRN
Start: 2021-04-23 — End: 2021-04-23
  Administered 2021-04-23: 4 mg via INTRAVENOUS

## 2021-04-23 MED ORDER — SODIUM CHLORIDE 0.9 % IR SOLN
Status: DC | PRN
Start: 2021-04-23 — End: 2021-04-23
  Administered 2021-04-23: 3000 mL

## 2021-04-23 MED ORDER — KETOROLAC TROMETHAMINE 30 MG/ML IJ SOLN
INTRAMUSCULAR | Status: AC
  Administered 2021-04-23: 30 mg via INTRAVENOUS
  Filled 2021-04-23: qty 1

## 2021-04-23 MED ORDER — HYDROMORPHONE HCL 1 MG/ML IJ SOLN
INTRAMUSCULAR | Status: AC
  Administered 2021-04-23: 0.5 mg via INTRAVENOUS
  Filled 2021-04-23: qty 2

## 2021-04-23 MED ORDER — POVIDONE-IODINE 10 % EX SWAB
2.0000 "application " | Freq: Once | CUTANEOUS | Status: AC
  Administered 2021-04-23: 2 via TOPICAL

## 2021-04-23 MED ORDER — PROPOFOL 10 MG/ML IV BOLUS
INTRAVENOUS | Status: DC | PRN
  Administered 2021-04-23: 200 mg via INTRAVENOUS
  Administered 2021-04-23: 100 mg via INTRAVENOUS

## 2021-04-23 MED ORDER — OXYCODONE HCL 5 MG/5ML PO SOLN: 5.0000 mg | Freq: Once | ORAL | Status: AC | PRN

## 2021-04-23 MED ORDER — DEXAMETHASONE SODIUM PHOSPHATE 10 MG/ML IJ SOLN
INTRAMUSCULAR | Status: DC | PRN
  Administered 2021-04-23: 10 mg via INTRAVENOUS

## 2021-04-23 SURGICAL SUPPLY — 86 items
ANCHOR BUTTON TIGHTROPE RN 14 (Anchor) ×1 IMPLANT
ANCHOR SUT BIO SW 4.75X19.1 (Anchor) ×1 IMPLANT
ANCHOR TIGHTROPE II 20 (Anchor) ×1 IMPLANT
BANDAGE ESMARK 6X9 LF (GAUZE/BANDAGES/DRESSINGS) ×1 IMPLANT
BLADE EXCALIBUR 5.0X13 (MISCELLANEOUS) ×2 IMPLANT
BLADE SURG 15 STRL LF DISP TIS (BLADE) ×1 IMPLANT
BLADE SURG 15 STRL SS (BLADE) ×2
BLADE SURG SZ10 CARB STEEL (BLADE) ×2 IMPLANT
BNDG ELASTIC 6X5.8 VLCR STR LF (GAUZE/BANDAGES/DRESSINGS) ×2 IMPLANT
BNDG ESMARK 6X9 LF (GAUZE/BANDAGES/DRESSINGS) ×2
BNDG GAUZE ELAST 4 BULKY (GAUZE/BANDAGES/DRESSINGS) ×2 IMPLANT
BNDG STRETCH GAUZE 3IN X12FT (GAUZE/BANDAGES/DRESSINGS) ×1 IMPLANT
BURR OVAL 8 FLU 4.0X13 (MISCELLANEOUS) ×2 IMPLANT
CANNULA 5.75X7 CRYSTAL CLEAR (CANNULA) ×1 IMPLANT
COVER BACK TABLE 60X90IN (DRAPES) ×2 IMPLANT
CUFF TOURN SGL QUICK 34 (TOURNIQUET CUFF) ×4
CUFF TRNQT CYL 34X4.125X (TOURNIQUET CUFF) ×1 IMPLANT
CUFF TRNQT CYL 34X4X40X1 (TOURNIQUET CUFF) ×1 IMPLANT
CUTTER FLIP II 9.5MM (INSTRUMENTS) ×1 IMPLANT
CUTTER SUT JUGGER STITCH CU (CUTTER) ×1 IMPLANT
DRAPE ARTHROSCOPY W/POUCH 114 (DRAPES) ×2 IMPLANT
DRAPE INCISE IOBAN 66X45 STRL (DRAPES) ×2 IMPLANT
DRAPE OEC MINIVIEW 54X84 (DRAPES) ×2 IMPLANT
DRAPE SHEET LG 3/4 BI-LAMINATE (DRAPES) ×2 IMPLANT
DRAPE U-SHAPE 47X51 STRL (DRAPES) ×2 IMPLANT
DRILL FLIPCUTTER III TGHTRP RT (Anchor) IMPLANT
DRSG PAD ABDOMINAL 8X10 ST (GAUZE/BANDAGES/DRESSINGS) ×3 IMPLANT
DURAPREP 26ML APPLICATOR (WOUND CARE) ×2 IMPLANT
DW OUTFLOW CASSETTE/TUBE SET (MISCELLANEOUS) ×2 IMPLANT
ELECT NDL BLADE 2-5/6 (NEEDLE) IMPLANT
ELECT NEEDLE BLADE 2-5/6 (NEEDLE) IMPLANT
ELECT REM PT RETURN 15FT ADLT (MISCELLANEOUS) ×2 IMPLANT
EXCALIBUR 3.8MM X 13CM (MISCELLANEOUS) ×2 IMPLANT
FIBERSTICK 2 (SUTURE) ×2 IMPLANT
FLIPCUTTER III TIGHTROPE RT (Anchor) ×2 IMPLANT
GAUZE 4X4 16PLY ~~LOC~~+RFID DBL (SPONGE) ×2 IMPLANT
GAUZE SPONGE 4X4 12PLY STRL (GAUZE/BANDAGES/DRESSINGS) ×3 IMPLANT
GAUZE XEROFORM 1X8 LF (GAUZE/BANDAGES/DRESSINGS) ×2 IMPLANT
GLOVE SRG 8 PF TXTR STRL LF DI (GLOVE) ×1 IMPLANT
GLOVE SURG ENC MOIS LTX SZ7 (GLOVE) ×2 IMPLANT
GLOVE SURG ENC MOIS LTX SZ8 (GLOVE) ×2 IMPLANT
GLOVE SURG UNDER POLY LF SZ7.5 (GLOVE) ×2 IMPLANT
GLOVE SURG UNDER POLY LF SZ8 (GLOVE) ×2
IMMOBILIZER KNEE 20 (SOFTGOODS) ×2
IMMOBILIZER KNEE 20 THIGH 36 (SOFTGOODS) IMPLANT
IV NS IRRIG 3000ML ARTHROMATIC (IV SOLUTION) ×8 IMPLANT
JUGGERSTITCH IMPLANT STRAIGHT (Miscellaneous) ×1 IMPLANT
JUGGERSTITCH SLED CANNULA (MISCELLANEOUS) ×1 IMPLANT
Juggerswitch halfpipe cannula sled (Orthopedic Implant) ×1 IMPLANT
Juggerswitch suture cutter (Orthopedic Implant) ×1 IMPLANT
KIT BASIN OR (CUSTOM PROCEDURE TRAY) ×2 IMPLANT
KIT BIO-TENODESIS 3X8 DISP (MISCELLANEOUS) ×2
KIT INSRT BABSR STRL DISP BTN (MISCELLANEOUS) ×1 IMPLANT
KIT TURNOVER KIT A (KITS) IMPLANT
MANIFOLD NEPTUNE II (INSTRUMENTS) ×2 IMPLANT
NEEDLE HYPO 22GX1.5 SAFETY (NEEDLE) IMPLANT
PACK ARTHROSCOPY DSU (CUSTOM PROCEDURE TRAY) ×2 IMPLANT
PENCIL SMOKE EVACUATOR (MISCELLANEOUS) IMPLANT
PORT APPOLLO RF 90DEGREE MULTI (SURGICAL WAND) ×2 IMPLANT
SET PAD KNEE POSITIONER (MISCELLANEOUS) ×2 IMPLANT
SPONGE T-LAP 4X18 ~~LOC~~+RFID (SPONGE) ×2 IMPLANT
STRIP CLOSURE SKIN 1/2X4 (GAUZE/BANDAGES/DRESSINGS) IMPLANT
SUCTION FRAZIER HANDLE 10FR (MISCELLANEOUS) ×2
SUCTION TUBE FRAZIER 10FR DISP (MISCELLANEOUS) ×1 IMPLANT
SUT 2 FIBERLOOP 20 STRT BLUE (SUTURE)
SUT ETHILON 4 0 PS 2 18 (SUTURE) ×2 IMPLANT
SUT FIBERWIRE #2 38 REV NDL BL (SUTURE)
SUT FIBERWIRE #2 38 T-5 BLUE (SUTURE) ×2
SUT MNCRL AB 3-0 PS2 18 (SUTURE) ×2 IMPLANT
SUT VIC AB 0 CT2 27 (SUTURE) ×4 IMPLANT
SUT VIC AB 2-0 CT2 27 (SUTURE) ×2 IMPLANT
SUT VIC AB 2-0 SH 27 (SUTURE) ×4
SUT VIC AB 2-0 SH 27XBRD (SUTURE) ×2 IMPLANT
SUTURE 2 FIBERLOOP 20 STRT BLU (SUTURE) IMPLANT
SUTURE FIBERWR #2 38 T-5 BLUE (SUTURE) ×1 IMPLANT
SUTURE FIBERWR#2 38 REV NDL BL (SUTURE) IMPLANT
SUTURE TAPE TIGERLINK 1.3MM BL (SUTURE) ×1 IMPLANT
SUTURE TIGERSTICK 2 TIGERWIR 2 (MISCELLANEOUS) ×1 IMPLANT
SUTURETAPE TIGERLINK 1.3MM BL (SUTURE) ×2
SYR CONTROL 10ML LL (SYRINGE) ×2 IMPLANT
TIGERSTICK 2 TIGERWIRE 2 (MISCELLANEOUS) ×2
TOWEL OR 17X26 10 PK STRL BLUE (TOWEL DISPOSABLE) ×2 IMPLANT
TUBING ARTHROSCOPY IRRIG 16FT (MISCELLANEOUS) ×2 IMPLANT
TUBING CONNECTING 10 (TUBING) ×4 IMPLANT
WAND APOLLORF SJ50 AR-9845 (SURGICAL WAND) ×2 IMPLANT
WATER STERILE IRR 500ML POUR (IV SOLUTION) ×2 IMPLANT

## 2021-04-23 NOTE — Anesthesia Postprocedure Evaluation (Signed)
Anesthesia Post Note ? ?Patient: Regina Griffith ? ?Procedure(s) Performed: KNEE ARTHROSCOPY WITH HAMSTRING AUTOGRAFT ANTERIOR CRUCIATE LIGAMENT (ACL) REPAIR, PARTIAL LATERAL MENISECTOMY VS REPAIR (Right: Knee) ? ?  ? ?Patient location during evaluation: PACU ?Anesthesia Type: Regional and General ?Level of consciousness: awake ?Pain management: pain level controlled ?Vital Signs Assessment: post-procedure vital signs reviewed and stable ?Respiratory status: spontaneous breathing, nonlabored ventilation, respiratory function stable and patient connected to nasal cannula oxygen ?Cardiovascular status: blood pressure returned to baseline and stable ?Postop Assessment: no apparent nausea or vomiting ?Anesthetic complications: no ? ? ?No notable events documented. ? ?Last Vitals:  ?Vitals:  ? 04/23/21 1315 04/23/21 1345  ?BP: 116/77 122/90  ?Pulse: 62 74  ?Resp: 13 16  ?Temp:    ?SpO2: 100% 100%  ?  ?Last Pain:  ?Vitals:  ? 04/23/21 1345  ?TempSrc:   ?PainSc: 10-Worst pain ever  ? ? ?  ?  ?  ?  ?  ?  ? ?Alante Weimann P Aesha Agrawal ? ? ? ? ?

## 2021-04-23 NOTE — Progress Notes (Signed)
Orthopedic Tech Progress Note ?Patient Details:  ?Regina Griffith ?1990/07/26 ?[id number] ? ?Patient ID: Regina Griffith, female   DOB: 1990/05/11, 31 y.o.   MRN: [id number] ? ?Regina Griffith ?04/23/2021, 1:57 PM ?Crutches sized and instructed on use. Delivered to patient in pacu. ?

## 2021-04-23 NOTE — Transfer of Care (Signed)
Immediate Anesthesia Transfer of Care Note ? ?Patient: Regina Griffith ? ?Procedure(s) Performed: KNEE ARTHROSCOPY WITH HAMSTRING AUTOGRAFT ANTERIOR CRUCIATE LIGAMENT (ACL) REPAIR, PARTIAL LATERAL MENISECTOMY VS REPAIR (Right: Knee) ? ?Patient Location: PACU ? ?Anesthesia Type:General ? ?Level of Consciousness: awake, alert  and oriented ? ?Airway & Oxygen Therapy: Patient Spontanous Breathing and Patient connected to face mask oxygen ? ?Post-op Assessment: Report given to RN and Post -op Vital signs reviewed and stable ? ?Post vital signs: Reviewed and stable ? ?Last Vitals:  ?Vitals Value Taken Time  ?BP 135/90 04/23/21 1204  ?Temp 36.4 ?C 04/23/21 1204  ?Pulse 65 04/23/21 1206  ?Resp 16 04/23/21 1206  ?SpO2 100 % 04/23/21 1206  ?Vitals shown include unvalidated device data. ? ?Last Pain:  ?Vitals:  ? 04/23/21 0820  ?TempSrc:   ?PainSc: 0-No pain  ?   ? ?Patients Stated Pain Goal: 2 (04/23/21 0820) ? ?Complications: No notable events documented. ?

## 2021-04-23 NOTE — Anesthesia Procedure Notes (Signed)
Anesthesia Regional Block: Adductor canal block  ? ?Pre-Anesthetic Checklist: , timeout performed,  Correct Patient, Correct Site, Correct Laterality,  Correct Procedure,, site marked,  Risks and benefits discussed,  Surgical consent,  Pre-op evaluation,  At surgeon's request and post-op pain management ? ?Laterality: Right ? ?Prep: chloraprep     ?  ?Needles:  ?Injection technique: Single-shot ? ?Needle Type: Echogenic Stimulator Needle   ? ? ?Needle Length: 9cm  ?Needle Gauge: 21  ? ? ? ?Additional Needles: ? ? ?Procedures:,,,, ultrasound used (permanent image in chart),,    ?Narrative:  ?Start time: 04/23/2021 9:25 AM ?End time: 04/23/2021 9:35 AM ?Injection made incrementally with aspirations every 5 mL. ? ?Performed by: Personally  ?Anesthesiologist: Leonides Grills, MD ? ?Additional Notes: ?Functioning IV was confirmed and monitors were applied. A time-out was performed. Hand hygiene and sterile gloves were used. The thigh was placed in a frog-leg position and prepped in a sterile fashion. A 21mm 21ga Arrow echogenic stimulator needle was placed using ultrasound guidance.  Negative aspiration and negative test dose prior to incremental administration of local anesthetic. The patient tolerated the procedure well. ? ? ? ? ? ?

## 2021-04-23 NOTE — Interval H&P Note (Signed)
History and Physical Interval Note: ? ?04/23/2021 ?9:16 AM ? ?Regina Griffith  has presented today for surgery, with the diagnosis of Right knee anterior cruciate ligament tear,lateral meniscus tear.  The various methods of treatment have been discussed with the patient and family. After consideration of risks, benefits and other options for treatment, the patient has consented to  Procedure(s) with comments: ?KNEE ARTHROSCOPY WITH HAMSTRING AUTOGRAFT ANTERIOR CRUCIATE LIGAMENT (ACL) REPAIR, PARTIAL LATERAL MENISECTOMY VS REPAIR (Right) - with adductor canal and knee block as a surgical intervention.  The patient's history has been reviewed, patient examined, no change in status, stable for surgery.  I have reviewed the patient's chart and labs.  Questions were answered to the patient's satisfaction.   ? ? ?Leighton Brickley ANDREW ? ? ?

## 2021-04-23 NOTE — Anesthesia Preprocedure Evaluation (Signed)
Anesthesia Evaluation  ? ? ?Reviewed: ?Allergy & Precautions, Patient's Chart, lab work & pertinent test results ? ?Airway ?Mallampati: I ? ?TM Distance: >3 FB ?Neck ROM: Full ? ? ? Dental ?no notable dental hx. ? ?  ?Pulmonary ?asthma ,  ?  ?Pulmonary exam normal ?breath sounds clear to auscultation ? ? ? ? ? ? Cardiovascular ?negative cardio ROS ?Normal cardiovascular exam ?Rhythm:Regular Rate:Normal ? ? ?  ?Neuro/Psych ? Headaches, Anxiety   ? GI/Hepatic ?negative GI ROS, Neg liver ROS,   ?Endo/Other  ?negative endocrine ROS ? Renal/GU ?negative Renal ROS  ? ?  ?Musculoskeletal ?negative musculoskeletal ROS ?(+)  ? Abdominal ?  ?Peds ? Hematology ?negative hematology ROS ?(+)   ?Anesthesia Other Findings ?Right knee anterior cruciate ligament tear,lateral meniscus tear ? Reproductive/Obstetrics ?hcg negative ? ?  ? ? ? ? ? ? ? ? ? ? ? ? ? ?  ?  ? ? ? ? ? ? ? ? ?Anesthesia Physical ?Anesthesia Plan ? ?ASA: 2 ? ?Anesthesia Plan: General and Regional  ? ?Post-op Pain Management:   ? ?Induction: Intravenous ? ?PONV Risk Score and Plan: 3 and Ondansetron, Dexamethasone, Midazolam and Treatment may vary due to age or medical condition ? ?Airway Management Planned: LMA ? ?Additional Equipment:  ? ?Intra-op Plan:  ? ?Post-operative Plan: Extubation in OR ? ?Informed Consent: I have reviewed the patients History and Physical, chart, labs and discussed the procedure including the risks, benefits and alternatives for the proposed anesthesia with the patient or authorized representative who has indicated his/her understanding and acceptance.  ? ? ? ?Dental advisory given ? ?Plan Discussed with: CRNA ? ?Anesthesia Plan Comments:   ? ? ? ? ? ? ?Anesthesia Quick Evaluation ? ?

## 2021-04-23 NOTE — Op Note (Signed)
Preop diagnosis #1 right knee torn anterior cruciate ligament #2 possible torn lateral meniscus ?Postop diagnosis #1 complete rupture anterior cruciate ligament femoral attachment with retraction #2 peripheral tear red-white zone 1.5 cm posterior horn lateral meniscus ?Procedure #1 right knee arthroscopic assisted hamstring anterior cruciate ligament reconstruction autograft.  #2 lateral meniscal repair ?Surgeon Hart Robinsons, MD ?Assistant Elenor Legato, PA-C ?Anesthesia abductor canal block with general anesthesia ?Estimated blood loss minimal ?Drains none ?Tourniquet time 60 minutes at 300 mmHg ?Disposition PACU stable ?Complications none ? ?Operative details ?Patient was encountered in the holding area correct site identified and marked appropriately IV started abductor canal block administered per anesthesiologist.  TED hose was applied to uninvolved leg IV antibiotics were given less than 1 hour before surgical incision time.  Taken to the operating room placed in supine position properly padded out general anesthesia was performed.  Right lower extremity was elevated full extension flexion to 130 timeout was done.  2+ Lachman soft endpoint 2+ anterior drawer soft endpoint 1+ pivot shift.  Stable to varus and valgus stress.  Posterior lateral corner was stable.  Prepped with DuraPrep draped in sterile sterile fashion.  Exsanguinated Esmarch tourniquet inflated to 300 mmHg.  Incision was made over the pes tendons through the skin and subcutaneous tissue.  Small veins were coagulated sartorius fascia was identified and opened semitendinosus was identified separated from the insertion site and also left the gracilis intact the semitendinosus was then harvested in standard fashion protecting neurovascular structures including saphenous nerve graft was taken to the back table and prepared for 4 bundle all inside ACL by Ms. Leanne Hoss, PA-C I closed the sartorius fascia with a running Vicryl suture.  Knee was flexed  to 90 degrees inferomedial inferolateral portals were identified scope was introduced ACL was found to be completely torn off the femoral insertion site and retracted scar fat of the PCL as expected by MRI scan exam.  ACL fibers debrided PCL was protected notchplasty was performed in standard fashion over-the-top position was confirmed.  Patellofemoral joint revealed normal articular cartilage and tracking.  Lateral side was inspected normal articular cartilage but there was a 1.5 cm red-white tear peripheral posterior horn lateral meniscus medial to the popliteal hiatus it was reduced and meticulously fixed all inside [employer] meniscal set.  Following this attention directed medial to cartilage was normal as was the medial meniscus.  Femoral guide was hooked into the over-the-top position small stab wound was made laterally and Arthrex flip cutter was put into the anatomic ACL footprint and a retrosocket was performed FiberWire suture was passed and placed in the tunnel was rasped nice and smoothly at the orifice.  Debris was removed with a shaver.  Tibial Galsud into the anatomic ACL footprint set for 60 degrees a retrosocket was performed FiberWire suture was passed in place.  The graft was then delivered to the knee the femoral button was placed to the femoral socket pulled out lateral small incision was made IT band was opened and the back was put on lateral femoral cortex confirmed arthroscopically and visually.  The graft was then into the femoral socket in a retrograde fashion.  At 30 was of flexion the graft was then placed into the tibial socket and tensioned on both sides attached and secured with the tibial button.  We also had tightened appropriately position ? ?There is no notch impingement noted.  Knee was placed in full extension the internal brace was placed to a swivel lock knee is  put through range of motion now the knee was clinically stable with negative Lachman anterior drawer and  pivot shift.  Graft had excellent orientation and tension well pleased with the graft all sutures were cut.  The anteromedial incision 2-0 Vicryl was used for the subcu skin was closed with subcu microsuture the portals with nylon.  The distal lateral incision was closed with nylon.  She was then local block was performed with 20 cc of Sensorcaine after confirming anesthesia.  Sterile dressings applied tourniquet deflated after 60 minutes.  Second both dressings applied knees placed in full extension and immobilizer.  Ice pack was applied later.  The she had normal pulses in the foot and ankle in the case.  She is awakened taken operating PACU stable condition.  Should be stabilized in PACU and discharged home she will be seen back in office on Monday for examination and start physical therapy prognosis is excellent.  Standard protocol. ? ?To help with patient positioning prepping draping technical surgical assistance throughout this entire case suture management graft preparation wound closure application dressing and splint Ms. Leanne Hoss, PA-C assistance was needed.  Thank you ? ? ? ? ? ? ? ? ? ? ? ? ? ? ? ? ? ? ? ? ?

## 2021-04-23 NOTE — Anesthesia Procedure Notes (Signed)
Procedure Name: LMA Insertion ?Date/Time: 04/23/2021 10:17 AM ?Performed by: Orest Dikes, CRNA ?Pre-anesthesia Checklist: Patient identified, Emergency Drugs available, Suction available and Patient being monitored ?Patient Re-evaluated:Patient Re-evaluated prior to induction ?Oxygen Delivery Method: Circle system utilized ?Preoxygenation: Pre-oxygenation with 100% oxygen ?Induction Type: IV induction ?LMA: LMA with gastric port inserted ?LMA Size: 4.0 ?Number of attempts: 1 ?Placement Confirmation: positive ETCO2 and breath sounds checked- equal and bilateral ?Tube secured with: Tape ?Dental Injury: Teeth and Oropharynx as per pre-operative assessment  ? ? ? ? ?
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
## 2021-04-23 NOTE — Discharge Instructions (Signed)
Right knee ACL reconstruction, lateral meniscus repair: ?-Please take aspirin 81 mg twice a day 1 in the morning 1 in the evening for DVT prevention ?-Okay to take Tylenol 1000 mg 2-3 times a day with pain medication ?-Please ice and elevate the leg ?-Okay to start physical therapy 3-4 days after surgery ?-Please follow-up in the office in 3-4 days ?-Please take a stool softener while taking opioid pain medication ?-Please take a probiotic while taking antibiotics for 3 days ?-Leave bandages on until taken off by the office ?

## 2021-04-23 NOTE — Progress Notes (Signed)
AssistedDr. Bradley Ferris with right knee and adductor canal block. Side rails up, monitors on throughout procedure. See vital signs in flow sheet. Tolerated Procedure well. ? ?

## 2021-04-23 NOTE — Anesthesia Procedure Notes (Signed)
Anesthesia Regional Block: Knee block  ? ?Pre-Anesthetic Checklist: , timeout performed,  Correct Patient, Correct Site, Correct Laterality,  Correct Procedure, Correct Position, site marked,  Risks and benefits discussed,  Surgical consent,  Pre-op evaluation,  At surgeon's request and post-op pain management ? ?Laterality: Right ? ?Prep: chloraprep     ?  ?Needles:  ?Injection technique: Single-shot ? ?Needle Type: Other   ? ? ?Needle Length: 2.5cm  ?Needle Gauge: 23  ? ?Needle insertion depth: 2 cm ? ? ?Additional Needles: ? ? ?Narrative:  ?Start time: 04/23/2021 9:15 AM ?End time: 04/23/2021 9:25 AM ?Injection made incrementally with aspirations every 5 mL. ? ?Performed by: Personally  ?Anesthesiologist: Leonides Grills, MD ? ?Additional Notes: ?Functioning IV was confirmed and monitors were applied.  A timeout was performed. Sterile prep, hand hygiene and sterile gloves were used. Negative aspiration and negative test dose prior to incremental administration of local anesthetic lateral to the patella tendon. The patient tolerated the procedure well. ? ? ? ? ?

## 2021-04-25 ENCOUNTER — Encounter (HOSPITAL_COMMUNITY): Payer: Self-pay | Admitting: Specialist
